# Patient Record
Sex: Female | Born: 1970 | Race: Asian | Hispanic: No | Marital: Married | Smoking: Never smoker
Health system: Southern US, Community
[De-identification: ages and names within clinical notes are randomized; demographics above are authoritative.]

## PROBLEM LIST (undated history)

## (undated) DIAGNOSIS — N920 Excessive and frequent menstruation with regular cycle: Secondary | ICD-10-CM

## (undated) DIAGNOSIS — I10 Essential (primary) hypertension: Secondary | ICD-10-CM

## (undated) DIAGNOSIS — E7800 Pure hypercholesterolemia, unspecified: Secondary | ICD-10-CM

## (undated) DIAGNOSIS — E041 Nontoxic single thyroid nodule: Secondary | ICD-10-CM

## (undated) DIAGNOSIS — G478 Other sleep disorders: Secondary | ICD-10-CM

## (undated) DIAGNOSIS — D649 Anemia, unspecified: Secondary | ICD-10-CM

## (undated) DIAGNOSIS — Z9289 Personal history of other medical treatment: Secondary | ICD-10-CM

## (undated) DIAGNOSIS — O039 Complete or unspecified spontaneous abortion without complication: Secondary | ICD-10-CM

---

## 2004-08-04 ENCOUNTER — Other Ambulatory Visit: Admission: RE | Admit: 2004-08-04 | Discharge: 2004-08-04 | Payer: Self-pay | Admitting: Gynecology

## 2004-09-27 ENCOUNTER — Encounter: Payer: Self-pay | Admitting: Emergency Medicine

## 2004-09-27 ENCOUNTER — Encounter: Payer: Self-pay | Admitting: Gynecology

## 2004-09-27 ENCOUNTER — Inpatient Hospital Stay (HOSPITAL_COMMUNITY): Admission: EM | Admit: 2004-09-27 | Discharge: 2004-10-06 | Payer: Self-pay | Admitting: Gynecology

## 2004-10-07 ENCOUNTER — Inpatient Hospital Stay (HOSPITAL_COMMUNITY): Admission: AD | Admit: 2004-10-07 | Discharge: 2004-10-07 | Payer: Self-pay | Admitting: Gynecology

## 2004-12-28 ENCOUNTER — Inpatient Hospital Stay (HOSPITAL_COMMUNITY): Admission: AD | Admit: 2004-12-28 | Discharge: 2004-12-30 | Payer: Self-pay | Admitting: Gynecology

## 2006-05-21 ENCOUNTER — Other Ambulatory Visit: Admission: RE | Admit: 2006-05-21 | Discharge: 2006-05-21 | Payer: Self-pay | Admitting: Family Medicine

## 2007-09-14 ENCOUNTER — Other Ambulatory Visit: Admission: RE | Admit: 2007-09-14 | Discharge: 2007-09-14 | Payer: Self-pay | Admitting: Family Medicine

## 2007-11-08 ENCOUNTER — Encounter (INDEPENDENT_AMBULATORY_CARE_PROVIDER_SITE_OTHER): Payer: Self-pay | Admitting: Interventional Radiology

## 2007-11-08 ENCOUNTER — Encounter: Admission: RE | Admit: 2007-11-08 | Discharge: 2007-11-08 | Payer: Self-pay | Admitting: Family Medicine

## 2007-11-08 ENCOUNTER — Other Ambulatory Visit: Admission: RE | Admit: 2007-11-08 | Discharge: 2007-11-08 | Payer: Self-pay | Admitting: Interventional Radiology

## 2009-03-26 ENCOUNTER — Other Ambulatory Visit: Admission: RE | Admit: 2009-03-26 | Discharge: 2009-03-26 | Payer: Self-pay | Admitting: Family Medicine

## 2009-06-17 IMAGING — US US BIOPSY
1 series · 14 of 19 positions shown · non-contrast
Comparison: none

CLINICAL HISTORY: Bilateral thyroid nodules.

[Series 1: us biopsy · 0.08mm/px · 19 acquisitions, 14 frames shown]
[im 1/19]
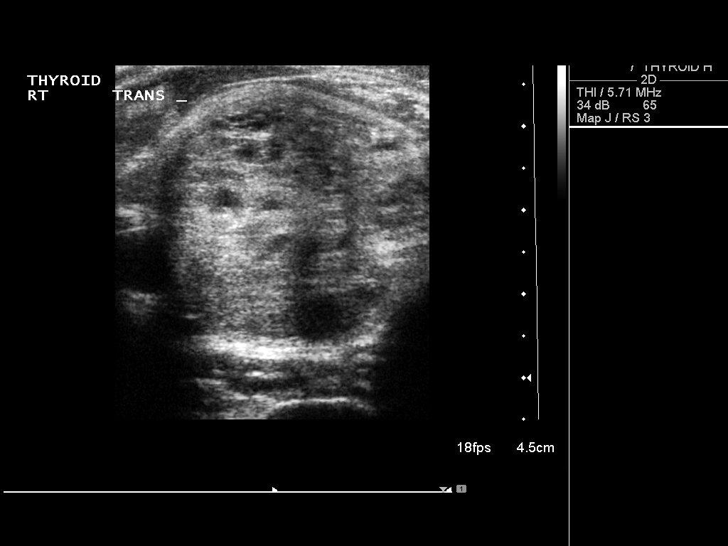
[im 3/19]
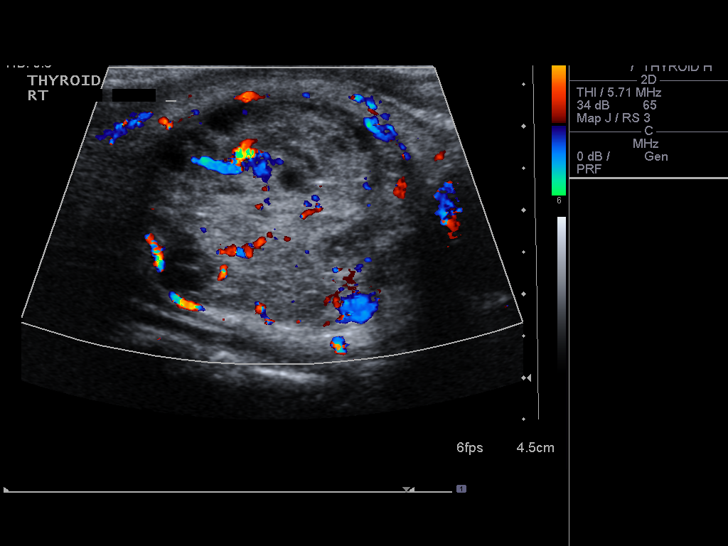
[im 4/19]
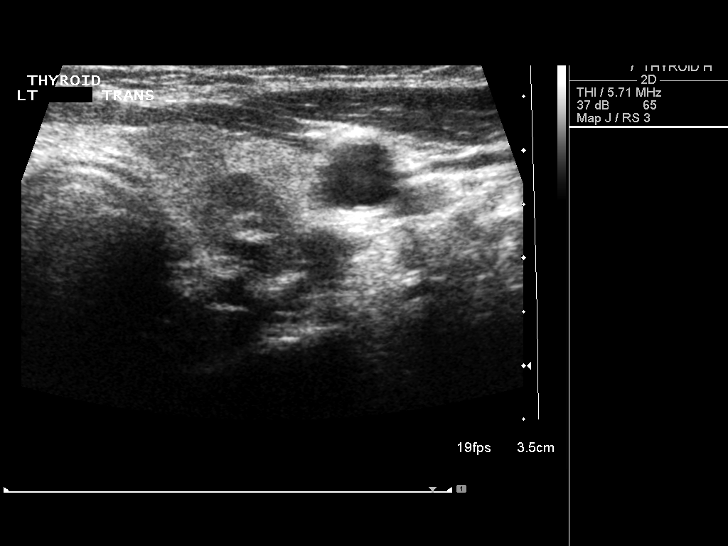
[im 5/19]
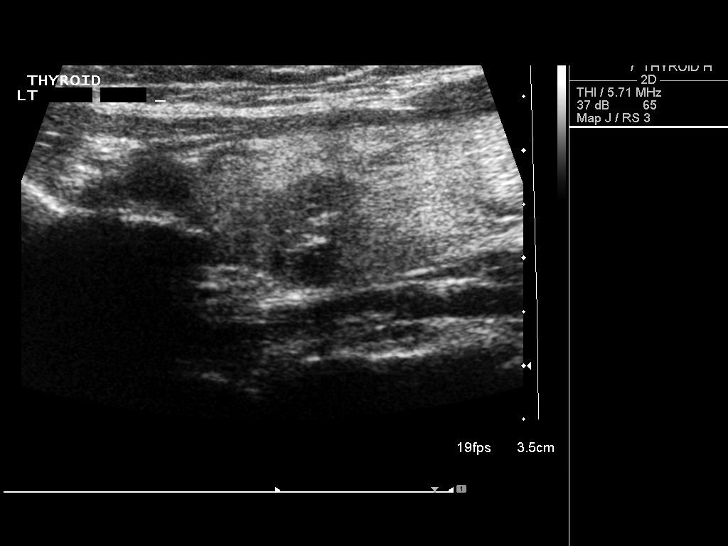
[im 7/19]
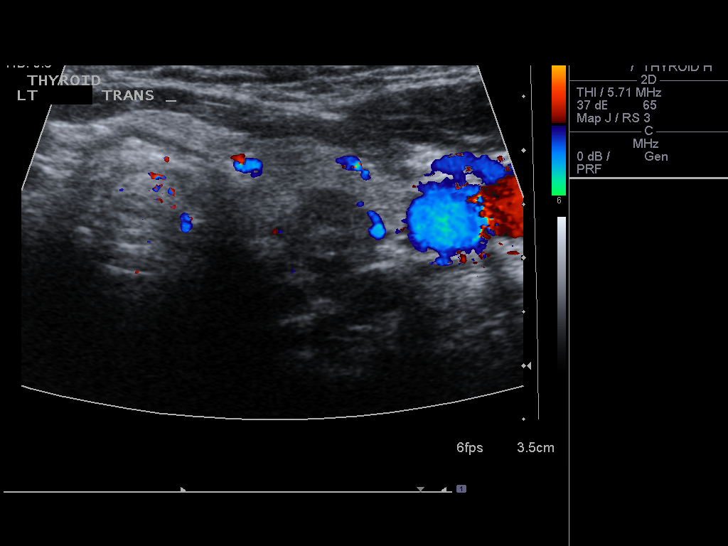
[im 8/19]
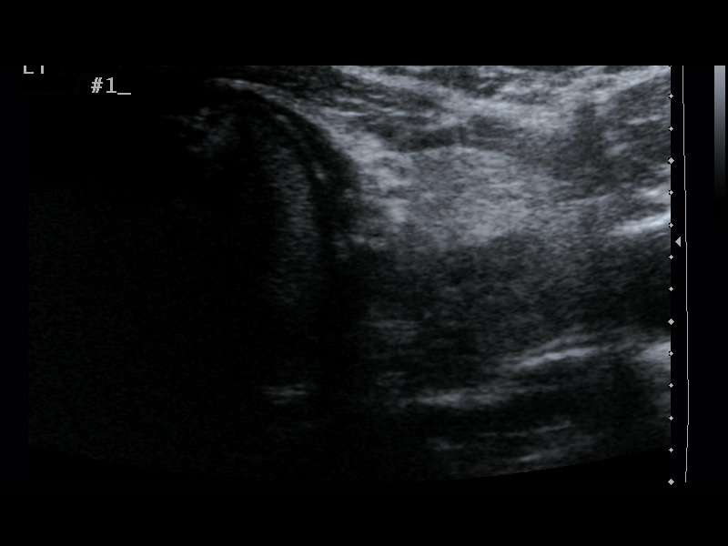
[im 9/19]
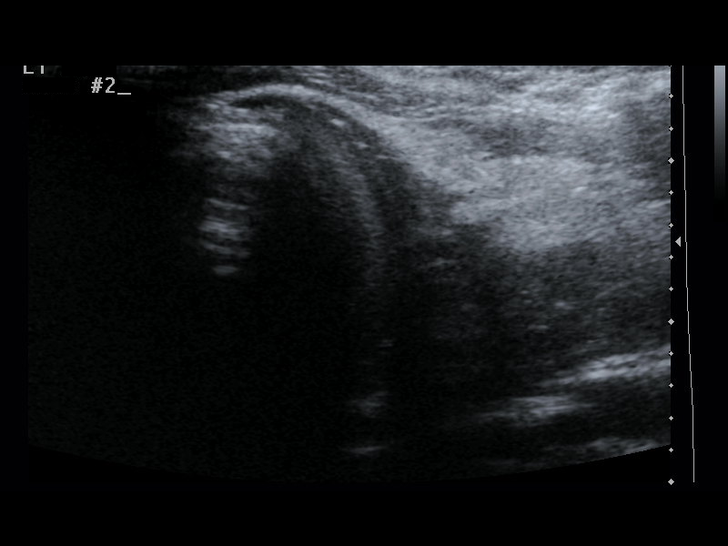
[im 11/19]
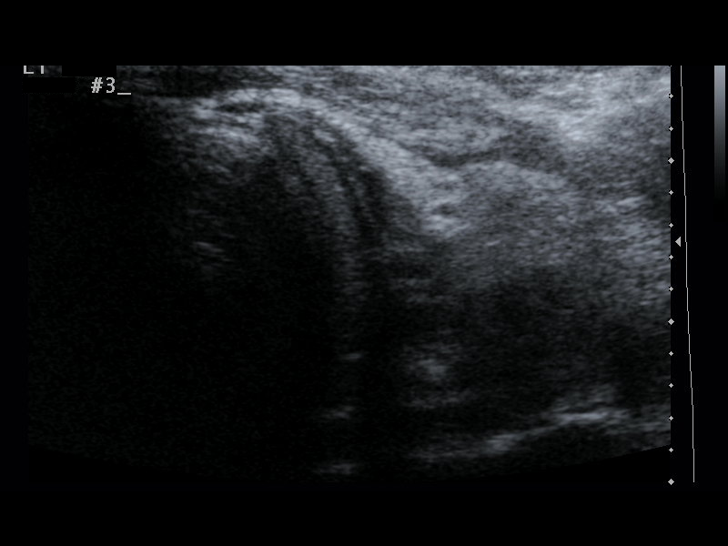
[im 12/19]
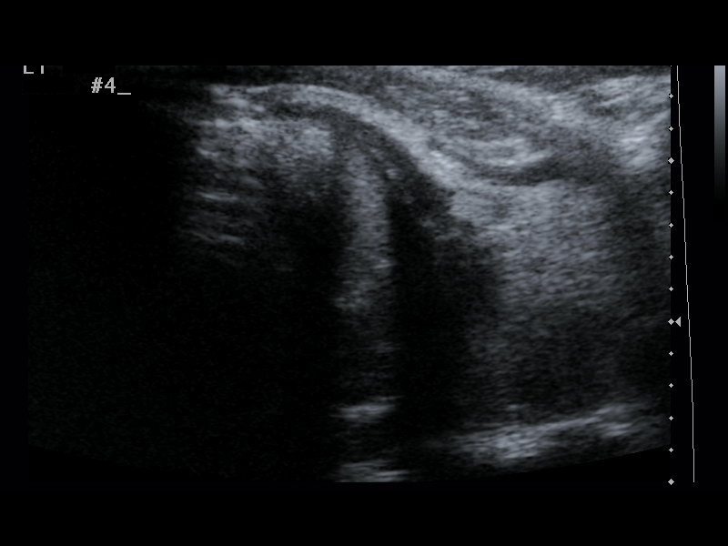
[im 13/19]
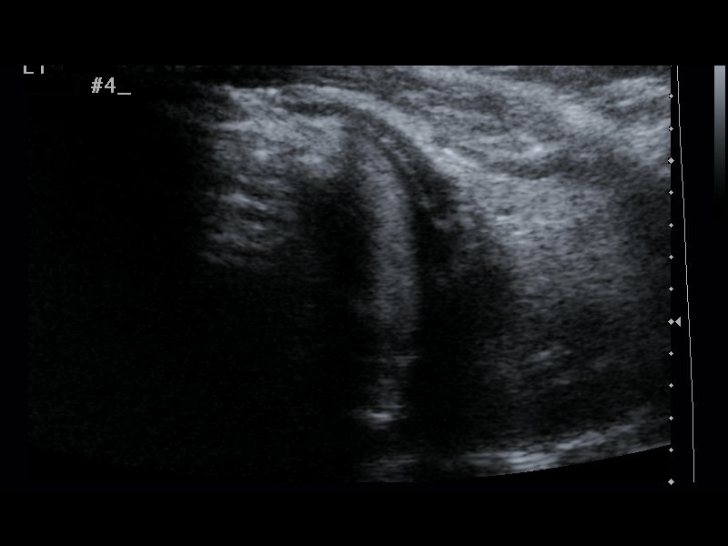
[im 15/19]
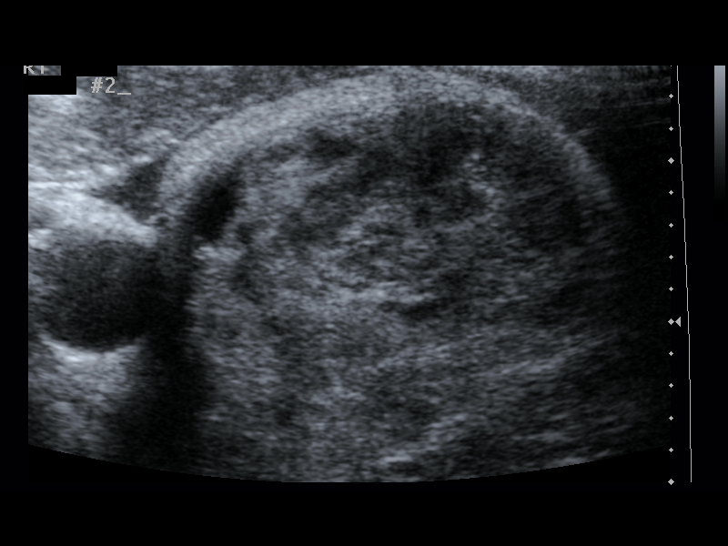
[im 16/19]
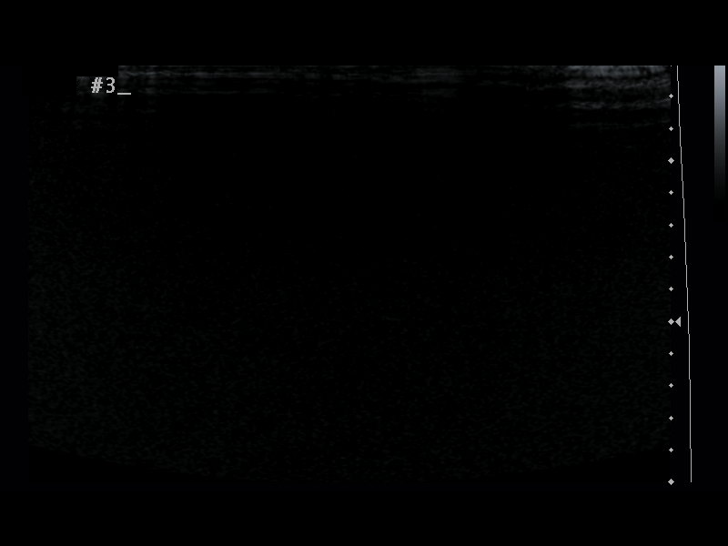
[im 17/19]
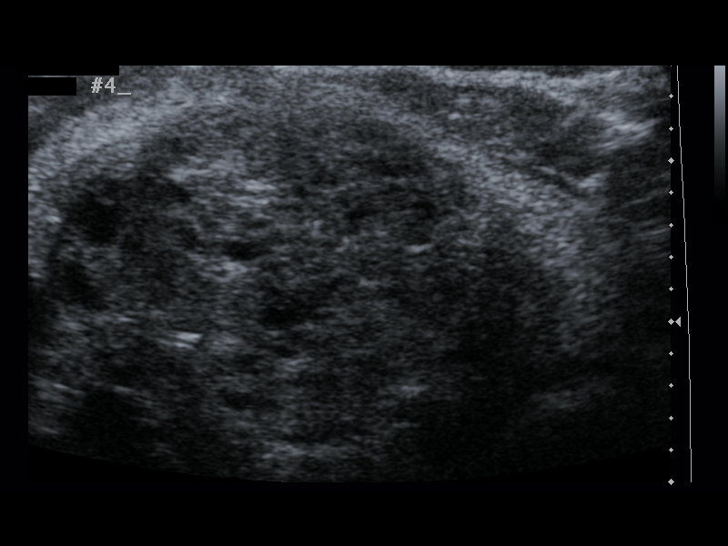
[im 19/19]
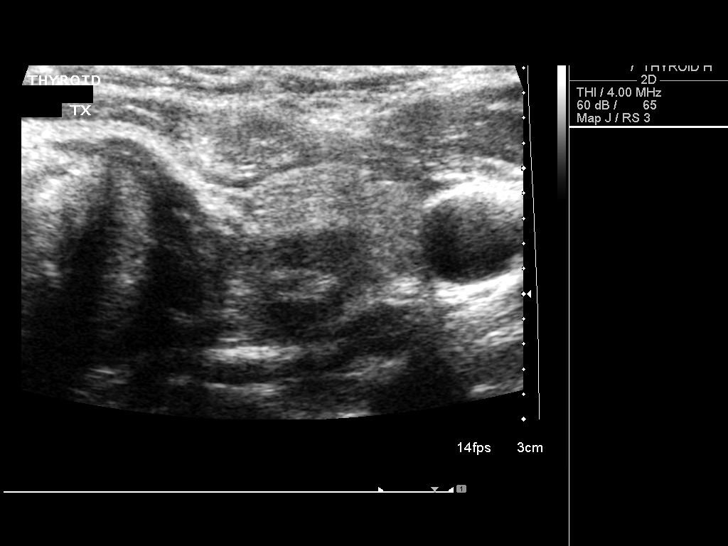

[14 of 19 positions shown; findings below may reference images not displayed]

PROCEDURE(S): ULTRASOUND GUIDED FINE NEEDLE ASPIRATION OF BILATERAL
THYROID NODULES

Medications:None

Sedation time:None

Procedure:Informed consent was obtained.  The patient was placed in
a supine position.  Ultrasound demonstrated a dominant right
thyroid nodule.  A small irregular nodule was identified in the
superior left thyroid nodule.  The neck was prepped with Betadine.
The skin was anesthetized with 1% lidocaine.  Using ultrasound
guidance, four fine needle aspirations were obtained of the left
superior thyroid nodule.  Four fine needle aspirations were
obtained of the large right thyroid nodule.  No immediate
complications.
FINDINGS: Large heterogeneous lesion involving the lower pole of the
right thyroid gland.  There are multiple left thyroid nodules.  The
nodule in the superior aspect of the left thyroid nodule has
internal echogenicity that could represent calcifications.
IMPRESSION: Ultrasound guided bilateral thyroid biopsies.

## 2010-03-23 DIAGNOSIS — E041 Nontoxic single thyroid nodule: Secondary | ICD-10-CM

## 2010-03-23 HISTORY — DX: Nontoxic single thyroid nodule: E04.1

## 2010-04-13 ENCOUNTER — Encounter: Payer: Self-pay | Admitting: Family Medicine

## 2010-05-26 ENCOUNTER — Other Ambulatory Visit (HOSPITAL_COMMUNITY)
Admission: RE | Admit: 2010-05-26 | Discharge: 2010-05-26 | Disposition: A | Discharge status: Home / Self Care | Payer: 59 | Source: Ambulatory Visit | Attending: Family Medicine | Admitting: Family Medicine

## 2010-05-26 ENCOUNTER — Other Ambulatory Visit: Payer: Self-pay | Admitting: Family Medicine

## 2010-05-26 DIAGNOSIS — Z124 Encounter for screening for malignant neoplasm of cervix: Secondary | ICD-10-CM | POA: Insufficient documentation

## 2010-05-26 DIAGNOSIS — Z1159 Encounter for screening for other viral diseases: Secondary | ICD-10-CM | POA: Insufficient documentation

## 2010-05-27 ENCOUNTER — Other Ambulatory Visit: Payer: Self-pay | Admitting: Obstetrics & Gynecology

## 2010-05-27 DIAGNOSIS — Z1231 Encounter for screening mammogram for malignant neoplasm of breast: Secondary | ICD-10-CM

## 2010-05-28 ENCOUNTER — Other Ambulatory Visit: Payer: Self-pay | Admitting: Family Medicine

## 2010-05-28 DIAGNOSIS — E041 Nontoxic single thyroid nodule: Secondary | ICD-10-CM

## 2010-06-05 ENCOUNTER — Ambulatory Visit: Payer: Self-pay

## 2010-06-05 ENCOUNTER — Other Ambulatory Visit: Payer: Self-pay
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
## 2016-03-26 DIAGNOSIS — N926 Irregular menstruation, unspecified: Secondary | ICD-10-CM | POA: Diagnosis not present

## 2016-03-26 DIAGNOSIS — E041 Nontoxic single thyroid nodule: Secondary | ICD-10-CM | POA: Diagnosis not present

## 2016-03-26 DIAGNOSIS — R5383 Other fatigue: Secondary | ICD-10-CM | POA: Diagnosis not present

## 2016-03-27 ENCOUNTER — Observation Stay (HOSPITAL_COMMUNITY): Payer: 59

## 2016-03-27 ENCOUNTER — Inpatient Hospital Stay (HOSPITAL_COMMUNITY)
Admission: EM | Admit: 2016-03-27 | Discharge: 2016-03-29 | DRG: 761 | Disposition: A | Discharge status: Home / Self Care | Payer: 59 | Attending: Internal Medicine | Admitting: Internal Medicine

## 2016-03-27 ENCOUNTER — Encounter (HOSPITAL_COMMUNITY): Payer: Self-pay

## 2016-03-27 ENCOUNTER — Other Ambulatory Visit (HOSPITAL_COMMUNITY): Payer: Self-pay

## 2016-03-27 DIAGNOSIS — D649 Anemia, unspecified: Secondary | ICD-10-CM | POA: Diagnosis not present

## 2016-03-27 DIAGNOSIS — E042 Nontoxic multinodular goiter: Secondary | ICD-10-CM | POA: Diagnosis present

## 2016-03-27 DIAGNOSIS — N859 Noninflammatory disorder of uterus, unspecified: Secondary | ICD-10-CM

## 2016-03-27 DIAGNOSIS — N924 Excessive bleeding in the premenopausal period: Secondary | ICD-10-CM | POA: Diagnosis not present

## 2016-03-27 DIAGNOSIS — D509 Iron deficiency anemia, unspecified: Secondary | ICD-10-CM | POA: Diagnosis present

## 2016-03-27 DIAGNOSIS — E611 Iron deficiency: Secondary | ICD-10-CM | POA: Diagnosis not present

## 2016-03-27 DIAGNOSIS — N920 Excessive and frequent menstruation with regular cycle: Secondary | ICD-10-CM | POA: Diagnosis present

## 2016-03-27 DIAGNOSIS — D259 Leiomyoma of uterus, unspecified: Principal | ICD-10-CM | POA: Diagnosis present

## 2016-03-27 DIAGNOSIS — E041 Nontoxic single thyroid nodule: Secondary | ICD-10-CM | POA: Diagnosis not present

## 2016-03-27 DIAGNOSIS — K5900 Constipation, unspecified: Secondary | ICD-10-CM | POA: Diagnosis present

## 2016-03-27 DIAGNOSIS — N858 Other specified noninflammatory disorders of uterus: Secondary | ICD-10-CM | POA: Diagnosis present

## 2016-03-27 DIAGNOSIS — N939 Abnormal uterine and vaginal bleeding, unspecified: Secondary | ICD-10-CM

## 2016-03-27 DIAGNOSIS — R634 Abnormal weight loss: Secondary | ICD-10-CM | POA: Diagnosis present

## 2016-03-27 HISTORY — DX: Complete or unspecified spontaneous abortion without complication: O03.9

## 2016-03-27 HISTORY — DX: Excessive and frequent menstruation with regular cycle: N92.0

## 2016-03-27 HISTORY — DX: Nontoxic single thyroid nodule: E04.1

## 2016-03-27 LAB — COMPREHENSIVE METABOLIC PANEL
ALK PHOS: 53 U/L (ref 38–126)
ALT: 10 U/L — AB (ref 14–54)
AST: 22 U/L (ref 15–41)
Albumin: 3.8 g/dL (ref 3.5–5.0)
Anion gap: 10 (ref 5–15)
BUN: 10 mg/dL (ref 6–20)
CALCIUM: 8.8 mg/dL — AB (ref 8.9–10.3)
CO2: 23 mmol/L (ref 22–32)
CREATININE: 0.89 mg/dL (ref 0.44–1.00)
Chloride: 103 mmol/L (ref 101–111)
GFR calc non Af Amer: >60 mL/min (ref >60)
Glucose, Bld: 112 mg/dL — ABNORMAL HIGH (ref 65–99)
Potassium: 3.6 mmol/L (ref 3.5–5.1)
SODIUM: 136 mmol/L (ref 135–145)
Total Bilirubin: 0.4 mg/dL (ref 0.3–1.2)
Total Protein: 6.9 g/dL (ref 6.5–8.1)

## 2016-03-27 LAB — CBC
HCT: 13.6 % — ABNORMAL LOW (ref 36.0–46.0)
Hemoglobin: 3.3 g/dL — CL (ref 12.0–15.0)
MCH: 12.7 pg — ABNORMAL LOW (ref 26.0–34.0)
MCHC: 24.3 g/dL — AB (ref 30.0–36.0)
MCV: 52.5 fL — AB (ref 78.0–100.0)
PLATELETS: 567 10*3/uL — AB (ref 150–400)
RBC: 2.59 MIL/uL — ABNORMAL LOW (ref 3.87–5.11)
RDW: 24.9 % — AB (ref 11.5–15.5)
WBC: 3.7 10*3/uL — ABNORMAL LOW (ref 4.0–10.5)

## 2016-03-27 LAB — ABO/RH: ABO/RH(D): O POS

## 2016-03-27 LAB — PREPARE RBC (CROSSMATCH)

## 2016-03-27 MED ORDER — ACETAMINOPHEN 650 MG RE SUPP: 650.0000 mg | Freq: Four times a day (QID) | RECTAL | Status: DC | PRN

## 2016-03-27 MED ORDER — ONDANSETRON HCL 4 MG PO TABS: 4.0000 mg | ORAL_TABLET | Freq: Four times a day (QID) | ORAL | Status: DC | PRN

## 2016-03-27 MED ORDER — ONDANSETRON HCL 4 MG/2ML IJ SOLN: 4.0000 mg | Freq: Four times a day (QID) | INTRAMUSCULAR | Status: DC | PRN

## 2016-03-27 MED ORDER — SODIUM CHLORIDE 0.9 % IV SOLN: 250.0000 mL | INTRAVENOUS | Status: DC | PRN

## 2016-03-27 MED ORDER — FERROUS SULFATE 325 (65 FE) MG PO TABS
325.0000 mg | ORAL_TABLET | Freq: Three times a day (TID) | ORAL | Status: DC
  Administered 2016-03-28 – 2016-03-29 (×5): 325 mg via ORAL
  Filled 2016-03-27 (×5): qty 1

## 2016-03-27 MED ORDER — ACETAMINOPHEN 325 MG PO TABS
650.0000 mg | ORAL_TABLET | Freq: Four times a day (QID) | ORAL | Status: DC | PRN
  Administered 2016-03-28: 650 mg via ORAL
  Filled 2016-03-27: qty 2

## 2016-03-27 MED ORDER — SODIUM CHLORIDE 0.9% FLUSH: 3.0000 mL | INTRAVENOUS | Status: DC | PRN

## 2016-03-27 MED ORDER — SODIUM CHLORIDE 0.9 % IV SOLN
Freq: Once | INTRAVENOUS | Status: AC
  Administered 2016-03-27: 15:00:00 via INTRAVENOUS

## 2016-03-27 MED ORDER — SODIUM CHLORIDE 0.9% FLUSH
3.0000 mL | Freq: Two times a day (BID) | INTRAVENOUS | Status: DC
  Administered 2016-03-28 – 2016-03-29 (×3): 3 mL via INTRAVENOUS

## 2016-03-27 NOTE — ED Notes (Signed)
Pt returned from US

## 2016-03-27 NOTE — ED Notes (Signed)
Unable to take report will call back at [phone]

## 2016-03-27 NOTE — ED Triage Notes (Signed)
Per Pt, Pt went for check-up at her MD yesterday. Pt had blood work drawn and was noted to have hemoglobin of 3 per family member's report. Pt is pale upon arrival, and reports fatigue and lightheadedness. Denies any blood in stool or urine. Denies any nausea, vomiting, or diarrhea. Reports continual menstrual cycle since August.

## 2016-03-27 NOTE — ED Provider Notes (Signed)
[facility] DEPT Provider Note   CSN: [id number] Arrival date & time: 03/27/16  1240     History   Chief Complaint Chief Complaint  Patient presents with  . Anemia    HPI Jelani Babineau is a 46 y.o. female.  HPI G4 P3 female with constant vaginal bleeding for the past 5 months. States that has been using 4 pads daily. Over the last few weeks vaginal bleeding has decreased. He complains of lower abdominal fullness and pain. Has had lightheadedness and generalized fatigue. Denies any blood in the stool. Admits to 10 pound unintentional weight loss. Patient speaks [place] and son is at bedside interpreting. Patient does not have a GYN.  Past Medical History:  Diagnosis Date  . Menorrhagia   . Spontaneous abortion   . Thyroid nodule 2012    Patient Active Problem List   Diagnosis Date Noted  . Symptomatic anemia 03/27/2016  . Thyroid nodule 03/27/2016  . Iron deficiency 03/27/2016  . Menorrhagia 03/27/2016  . Uterine mass 03/27/2016  . Weight loss 03/27/2016    History reviewed. No pertinent surgical history.  OB History    No data available       Home Medications    Prior to Admission medications   Not on File    Family History Family History  Problem Relation Age of Onset  . Stroke Mother   . Thyroid disease Sister   . Heart disease Brother   . Hypertension Brother     Social History Social History  Substance Use Topics  . Smoking status: Never Smoker  . Smokeless tobacco: Never Used  . Alcohol use No     Allergies   Patient has no known allergies.   Review of Systems Review of Systems  Constitutional: Positive for fatigue. Negative for chills and fever.  Respiratory: Negative for shortness of breath.   Cardiovascular: Negative for chest pain.  Gastrointestinal: Positive for abdominal distention and abdominal pain. Negative for diarrhea, nausea and vomiting.  Genitourinary: Positive for pelvic pain and vaginal bleeding. Negative for  dysuria, flank pain, frequency, hematuria and vaginal discharge.  Musculoskeletal: Negative for back pain, neck pain and neck stiffness.  Neurological: Positive for dizziness, weakness (generalized) and light-headedness. Negative for numbness and headaches.  All other systems reviewed and are negative.    Physical Exam Updated Vital Signs BP 133/74   Pulse 68   Temp 97.9 F (36.6 C) (Oral)   Resp 19   Ht 5\' 6"  (1.676 m)   Wt 140 lb (63.5 kg)   SpO2 100%   BMI 22.60 kg/m   Physical Exam  Constitutional: She is oriented to person, place, and time. She appears well-developed and well-nourished. No distress.  Pale appearing  HENT:  Head: Normocephalic and atraumatic.  Mouth/Throat: Oropharynx is clear and moist.  Pale mucous membranes  Eyes: EOM are normal. Pupils are equal, round, and reactive to light.  Neck: Normal range of motion. Neck supple.  Cardiovascular: Normal rate and regular rhythm.  Exam reveals no gallop and no friction rub.   No murmur heard. Pulmonary/Chest: Effort normal and breath sounds normal.  Abdominal: Soft. Bowel sounds are normal. She exhibits distension. There is no tenderness. There is no rebound and no guarding.  Abdominal mass. Mass is firm and mildly tender to palpation. Nonpulsatile.  Genitourinary:  Genitourinary Comments: Small amount of blood in the vaginal vault.   Musculoskeletal: Normal range of motion. She exhibits no edema or tenderness.  No CVA tenderness bilaterally.  Neurological: She is  alert and oriented to person, place, and time.  Moving all extremities without deficit. Sensation intact.  Skin: Skin is warm and dry. No rash noted. No erythema.  Psychiatric: She has a normal mood and affect. Her behavior is normal.  Nursing note and vitals reviewed.    ED Treatments / Results  Labs (all labs ordered are listed, but only abnormal results are displayed) Labs Reviewed  COMPREHENSIVE METABOLIC PANEL - Abnormal; Notable for the  following:       Result Value   Glucose, Bld 112 (*)    Calcium 8.8 (*)    ALT 10 (*)    All other components within normal limits  CBC - Abnormal; Notable for the following:    WBC 3.7 (*)    RBC 2.59 (*)    Hemoglobin 3.3 (*)    HCT 13.6 (*)    MCV 52.5 (*)    MCH 12.7 (*)    MCHC 24.3 (*)    RDW 24.9 (*)    Platelets 567 (*)    All other components within normal limits  POC OCCULT BLOOD, ED  TYPE AND SCREEN  ABO/RH  PREPARE RBC (CROSSMATCH)    EKG  EKG Interpretation None       Radiology No results found.  Procedures Procedures (including critical care time)  Medications Ordered in ED Medications  0.9 %  sodium chloride infusion ( Intravenous New Bag/Given 03/27/16 1512)     Initial Impression / Assessment and Plan / ED Course  I have reviewed the triage vital signs and the nursing notes.  Pertinent labs & imaging results that were available during my care of the patient were reviewed by me and considered in my medical decision making (see chart for details).  Clinical Course     Discussed with Dr. Roselie Awkward who will arrange to have the patient followed up in clinic. Initiated transfusion para blood cells in the emergency department. Vital signs remained stable. [facility] to admit.  Final Clinical Impressions(s) / ED Diagnoses   Final diagnoses:  Vaginal bleeding    New Prescriptions New Prescriptions   No medications on file     Julianne Rice, MD 03/27/16 1642

## 2016-03-27 NOTE — Progress Notes (Signed)
Patient arrived from ED alert and oriented, IV fluids infusing, son at bedside who is her translator, VSS. No pain at the moment. Oriented to room and staff. Will continue to monitor.

## 2016-03-27 NOTE — H&P (Signed)
History and Physical:    [name]   [id number] DOB: 1971-02-24 DOA: 03/27/2016  Referring MD/provider: Dr. Lita Mains PCP: Tawanna Solo, MD   Patient coming from: Home  Chief Complaint: Excessive vaginal bleeding  History of Present Illness:   [name] is an 46 y.o. 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  Unfortunately the patient was lost to follow-up from clearly reasons. She does not speak [place], and no interpreter is available who speaks her regional [place]. She did not understand the [place] interpreter that was available. Her son, therefore, has provided most of the history. Patient states that she has had heavy vaginal bleeding for the past 5 months. She has had 4 pregnancies (one was a miscarriage). She does not take any medications except for occasional Tylenol or Advil for abdominal cramping. She saw a PCP yesterday for evaluation of generalized malaise, dizziness, and nonspecific complaints of "not feeling well". Routine labs resulted today showing a hemoglobin of 3.3, and therefore the patient was advised to come to the ED for further evaluation.  ED Course:  The patient had a confirmed hemoglobin of 3.3 mg/dL. Bedside ultrasound done by the EDP confirmed a large intrauterine mass, approximately 10 cm. Dr. Roselie Awkward of gynecology consulted who recommended stabilization and outpatient follow-up.  ROS:   Review of Systems  Constitutional: Positive for fever, malaise/fatigue and weight loss.       Loss of appetite  HENT: Positive for sinus pain and sore throat.   Eyes: Positive for blurred vision.  Respiratory: Positive for shortness of breath.   Cardiovascular: Positive for chest pain and palpitations.  Gastrointestinal: Positive for abdominal pain and nausea. Negative for blood in stool, melena and vomiting.  Genitourinary: Negative for dysuria and  hematuria.  Musculoskeletal: Negative.   Skin: Negative.   Neurological: Positive for dizziness and weakness.  Endo/Heme/Allergies: Does not bruise/bleed easily.  Psychiatric/Behavioral: Negative.     Past Medical History:   Past Medical History:  Diagnosis Date  . Menorrhagia   . Spontaneous abortion   . Thyroid nodule 2012    Past Surgical History:   History reviewed. No pertinent surgical history.  Social History:   Social History   Social History  . Marital status: Married    Spouse name: N/A  . Number of children: N/A  . Years of education: N/A   Occupational History  . Housekeeper    Social History Main Topics  . Smoking status: Never Smoker  . Smokeless tobacco: Never Used  . Alcohol use No  . Drug use: No  . Sexual activity: Not on file   Other Topics Concern  . 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  3 children: ages 39, 62 and 70.  Married.    Allergies   Patient has no known allergies.  Family history:   Family History  Problem Relation Age of Onset  . Stroke Mother   . Thyroid disease Sister   . Heart disease Brother   . Hypertension Brother     Current Medications:   Prior to Admission medications   Not on File    Physical Exam:   Vitals:   03/27/16 1530 03/27/16 1533 03/27/16 1545 03/27/16 1600  BP: 127/74 127/74 130/71 125/76  Pulse: 68 75 70 68  Resp: 10 18 13 17   Temp:  97.9 F (36.6 C)  TempSrc:  Oral    SpO2: 100% 100% 100% 100%  Weight:      Height:         Physical Exam: Blood pressure 125/76, pulse 68, temperature 97.9 F (36.6 C), temperature source Oral, resp. rate 17, height 5\' 6"  (1.676 m), weight 63.5 kg (140 lb), SpO2 100 %. Gen: No acute distress.Pale appearing. Head: Normocephalic, atraumatic. Eyes: Pupils equal, round and reactive to light. Extraocular movements intact.  Sclerae nonicteric. No lid lag. Mouth: Oropharynx reveals moist  mucous membranes. Dentition is good. Neck: Supple, no lymphadenopathy, no jugular venous distention. 3 cm right sided thyroid nodule palpable, firm. Chest: Lungs are clear to auscultation with good air movement. No rales, rhonchi or wheezes.  CV: Heart sounds are regular with an S1, S2. No murmurs, rubs, clicks, or gallops.  Abdomen: Mass felt that extends to the umbilicus, firm. Mildly tender to palpation. Extremities: Extremities are without clubbing, edema, or cyanosis. Pedal pulses 2+.  Skin: Warm and dry. No rashes, lesions or wounds. Pale. Neuro: Alert and oriented times 3; grossly nonfocal.  Psych: Insight is good and judgment is appropriate. Mood and affect normal.   Data Review:    Labs: Basic Metabolic Panel:  Recent Labs Lab 03/27/16 1305  NA 136  K 3.6  CL 103  CO2 23  GLUCOSE 112*  BUN 10  CREATININE 0.89  CALCIUM 8.8*   Liver Function Tests:  Recent Labs Lab 03/27/16 1305  AST 22  ALT 10*  ALKPHOS 53  BILITOT 0.4  PROT 6.9  ALBUMIN 3.8   CBC:  Recent Labs Lab 03/27/16 1305  WBC 3.7*  HGB 3.3*  HCT 13.6*  MCV 52.5*  PLT 567*    Radiographic Studies: No results found.  EKG: Independently reviewed. Sinus rhythm at 80 bpm. No ischemic changes.   Assessment/Plan:   Principal Problem:   Symptomatic anemia secondary to menorrhagia/uterine mass with associated iron deficiency Patient will need a further gynecologic evaluation. Patient will be referred for observation to stabilize her hemoglobin. Will start with 2 units of PRBCs, repeat her hemoglobin, and transfuse to hemoglobin greater than 7. Start iron supplementation. Transvaginal ultrasound ordered to evaluate uterine mass. The EDP has spoken with Dr. Roselie Awkward who will follow the patient posthospitalization. A discharge summary will be sent to Dr. Roselie Awkward on discharge to ensure appropriate follow-up.  Active Problems:   Thyroid nodule I have ordered a repeat thyroid ultrasound/biopsy. Check  TSH/free T4.    Weight loss Follow-up thyroid studies. Malignancy is a concern given uterine and thyroid masses.   Other information:   DVT prophylaxis: SCDs. Code Status: Full code. Family Communication: Son at the bedside.  Disposition Plan: Home 03/28/16 after correction of hemoglobin to greater than 7. Consults called: Gynecology: Dr. Roselie Awkward who recommends outpatient follow-up. Admission status: Observation.    The medical decision making is of moderate complexity, therefore this is a level 2 visit.  RAMA,CHRISTINA [facility] Pager [phone] Cell: [phone]   If 7PM-7AM, please contact night-coverage [email or web address] Password [email or web address] 03/27/2016, 4:32 PM

## 2016-03-27 NOTE — ED Notes (Signed)
Complains of pain all over her body and also of feeling weak all over for the past few months, edp at bedside, patient speaks minimal english and request that her son translate for her

## 2016-03-27 NOTE — ED Notes (Signed)
EKG given to Dr. Yelverton 

## 2016-03-27 NOTE — ED Notes (Signed)
Dr. Johnney Killian made aware of pts hgb of 3.3. No new verbal orders at this time. [place] Agricultural consultant also informed and pt to be takan back to next available room. Pt sitting in waiting room at this time playing on cell phone and interacting appropriately with family.

## 2016-03-28 DIAGNOSIS — R634 Abnormal weight loss: Secondary | ICD-10-CM | POA: Diagnosis present

## 2016-03-28 DIAGNOSIS — N920 Excessive and frequent menstruation with regular cycle: Secondary | ICD-10-CM | POA: Diagnosis not present

## 2016-03-28 DIAGNOSIS — D509 Iron deficiency anemia, unspecified: Secondary | ICD-10-CM | POA: Diagnosis present

## 2016-03-28 DIAGNOSIS — E042 Nontoxic multinodular goiter: Secondary | ICD-10-CM | POA: Diagnosis present

## 2016-03-28 DIAGNOSIS — D649 Anemia, unspecified: Secondary | ICD-10-CM

## 2016-03-28 DIAGNOSIS — D259 Leiomyoma of uterus, unspecified: Secondary | ICD-10-CM | POA: Diagnosis present

## 2016-03-28 DIAGNOSIS — K5900 Constipation, unspecified: Secondary | ICD-10-CM | POA: Diagnosis present

## 2016-03-28 LAB — PREPARE RBC (CROSSMATCH)

## 2016-03-28 LAB — CBC
HCT: 29.2 % — ABNORMAL LOW (ref 36.0–46.0)
HCT: 28.8 % — ABNORMAL LOW (ref 36.0–46.0)
Hemoglobin: 9 g/dL — ABNORMAL LOW (ref 12.0–15.0)
Hemoglobin: 8.6 g/dL — ABNORMAL LOW (ref 12.0–15.0)
MCH: 19.4 pg — ABNORMAL LOW (ref 26.0–34.0)
MCH: 18.7 pg — ABNORMAL LOW (ref 26.0–34.0)
MCHC: 30.8 g/dL (ref 30.0–36.0)
MCHC: 29.9 g/dL — ABNORMAL LOW (ref 30.0–36.0)
MCV: 62.9 fL — ABNORMAL LOW (ref 78.0–100.0)
MCV: 62.7 fL — ABNORMAL LOW (ref 78.0–100.0)
PLATELETS: 504 10*3/uL — AB (ref 150–400)
PLATELETS: 476 10*3/uL — AB (ref 150–400)
RBC: 4.64 MIL/uL (ref 3.87–5.11)
RBC: 4.59 MIL/uL (ref 3.87–5.11)
RDW: 28.9 % — AB (ref 11.5–15.5)
WBC: 10.5 10*3/uL (ref 4.0–10.5)
WBC: 9.7 10*3/uL (ref 4.0–10.5)

## 2016-03-28 LAB — HEMOGLOBIN AND HEMATOCRIT, BLOOD
HEMATOCRIT: 20 % — AB (ref 36.0–46.0)
HEMOGLOBIN: 5.7 g/dL — AB (ref 12.0–15.0)

## 2016-03-28 LAB — T4, FREE: Free T4: 1.06 ng/dL (ref 0.61–1.12)

## 2016-03-28 LAB — TSH: TSH: 1.316 u[IU]/mL (ref 0.350–4.500)

## 2016-03-28 MED ORDER — FOLIC ACID 1 MG PO TABS
1.0000 mg | ORAL_TABLET | Freq: Every day | ORAL | Status: DC
  Administered 2016-03-28 – 2016-03-29 (×2): 1 mg via ORAL
  Filled 2016-03-28 (×2): qty 1

## 2016-03-28 MED ORDER — VITAMIN B-12 1000 MCG PO TABS
1000.0000 ug | ORAL_TABLET | Freq: Every day | ORAL | Status: DC
  Administered 2016-03-28 – 2016-03-29 (×2): 1000 ug via ORAL
  Filled 2016-03-28 (×2): qty 1

## 2016-03-28 MED ORDER — SODIUM CHLORIDE 0.9 % IV SOLN: Freq: Once | INTRAVENOUS | Status: DC

## 2016-03-28 NOTE — Progress Notes (Signed)
[facility] Progress Note  Patient: [name] [id number]   PCP: Tawanna Solo, MD DOB: [birth year]   DOA: 03/27/2016   DOS: 03/28/2016   Date of Service: the patient was seen and examined on 03/28/2016  Brief hospital course: Pt. with PMH of Uterine fibroid, thyroid nodule; admitted on 03/27/2016, with complaint of fatigue, was found to have symptomatic anemia with hemoglobin of 3.3. Currently further plan is to monitor H&H.  Assessment and Plan: 1. Symptomatic anemia Likely from menorrhagia. Patient has large uterine fibroid. Patient will follow-up with OB/GYN as an outpatient. H&H appears to be relatively stable. We will monitor overnight. S/P 4 PRBC. We'll start the patient on iron supplementation as well as folic acid.  2. Thyroid nodule. Patient will require outpatient workup with endocrinology per  3. Constipation. Resolved. Bowel regimen initiated.  Bowel regimen: last BM 03/28/2016 Diet: Regular diet DVT Prophylaxis: mechanical compression device  Advance goals of care discussion: full code  Family Communication: family was present at bedside, at the time of interview. The pt provided permission to discuss medical plan with the family. Opportunity was given to ask question and all questions were answered satisfactorily.   Disposition:  Discharge to home. Expected discharge date: 03/29/2016,  Consultants: none Procedures: PRBC  Antibiotics: Anti-infectives    None        Subjective: Feeling better, complains about mild abdominal pain.  Objective: Physical Exam: Vitals:   03/28/16 0638 03/28/16 0700 03/28/16 0930 03/28/16 1259  BP: 119/81 122/74 124/81 125/79  Pulse: 70 71 78 80  Resp: 18 17 16 17   Temp: 98.6 F (37 C) 98.4 F (36.9 C) 98.4 F (36.9 C) 97.5 F (36.4 C)  TempSrc: Oral Oral Oral Oral  SpO2: 99% 100% 97% 100%  Weight:      Height:        Intake/Output Summary (Last 24 hours) at 03/28/16 1818 Last data filed at 03/28/16  1500  Gross per 24 hour  Intake             1880 ml  Output              202 ml  Net             1678 ml   Filed Weights   03/27/16 1257  Weight: 63.5 kg (140 lb)    General: Alert, Awake and Oriented to Time, Place and Person. Appear in mild distress, affect appropriate Eyes: PERRL, Conjunctiva normal ENT: Oral Mucosa clear moist. Neck: no JVD, no Abnormal Mass Or lumps Cardiovascular: S1 and S2 Present, no Murmur, Respiratory: Bilateral Air entry equal and Decreased, no use of accessory muscle, Clear to Auscultation, no Crackles, no wheezes Abdomen: Bowel Sound present, Soft and mild tenderness Skin: no redness, no Rash, no induration Extremities: no Pedal edema, no calf tenderness Neurologic: Grossly no focal neuro deficit. Bilaterally Equal motor strength  Data Reviewed: CBC:  Recent Labs Lab 03/27/16 1305 03/28/16 0019 03/28/16 1131 03/28/16 1540  WBC 3.7*  --  10.5 9.7  HGB 3.3* 5.7* 9.0* 8.6*  HCT 13.6* 20.0* 29.2* 28.8*  MCV 52.5*  --  62.9* 62.7*  PLT 567*  --  504* 0000000*   Basic Metabolic Panel:  Recent Labs Lab 03/27/16 1305  NA 136  K 3.6  CL 103  CO2 23  GLUCOSE 112*  BUN 10  CREATININE 0.89  CALCIUM 8.8*    Liver Function Tests:  Recent Labs Lab 03/27/16 1305  AST 22  ALT 10*  ALKPHOS 53  BILITOT 0.4  PROT 6.9  ALBUMIN 3.8    Studies: No results found.   Scheduled Meds: . sodium chloride   Intravenous Once  . ferrous sulfate  325 mg Oral TID WC  . sodium chloride flush  3 mL Intravenous Q12H   Continuous Infusions: PRN Meds: sodium chloride, acetaminophen **OR** acetaminophen, ondansetron **OR** ondansetron (ZOFRAN) IV, sodium chloride flush  Time spent: 30 minutes  Author: Berle Mull, MD [facility] Pager: [phone] 03/28/2016 6:18 PM  If 7PM-7AM, please contact night-coverage at [email or web address], password [email or web address]

## 2016-03-29 LAB — BASIC METABOLIC PANEL
ANION GAP: 9 (ref 5–15)
BUN: 7 mg/dL (ref 6–20)
CO2: 20 mmol/L — ABNORMAL LOW (ref 22–32)
Calcium: 9 mg/dL (ref 8.9–10.3)
Chloride: 108 mmol/L (ref 101–111)
Creatinine, Ser: 0.73 mg/dL (ref 0.44–1.00)
GFR calc Af Amer: >60 mL/min (ref >60)
GLUCOSE: 97 mg/dL (ref 65–99)
POTASSIUM: 3.5 mmol/L (ref 3.5–5.1)
Sodium: 137 mmol/L (ref 135–145)

## 2016-03-29 LAB — TYPE AND SCREEN
Blood Product Expiration Date: 201801122359
Blood Product Expiration Date: 201801122359
Blood Product Expiration Date: 201801192359
Blood Product Expiration Date: 201801192359
ISSUE DATE / TIME: 201801051509
ISSUE DATE / TIME: 201801060631
ISSUE DATE / TIME: 201801051948
ISSUE DATE / TIME: 201801060320
UNIT TYPE AND RH: 5100
UNIT TYPE AND RH: 5100
Unit Type and Rh: 5100
Unit Type and Rh: 5100

## 2016-03-29 LAB — CBC
HEMATOCRIT: 29.6 % — AB (ref 36.0–46.0)
Hemoglobin: 8.8 g/dL — ABNORMAL LOW (ref 12.0–15.0)
MCH: 19 pg — ABNORMAL LOW (ref 26.0–34.0)
MCHC: 29.7 g/dL — ABNORMAL LOW (ref 30.0–36.0)
MCV: 64.1 fL — AB (ref 78.0–100.0)
PLATELETS: 456 10*3/uL — AB (ref 150–400)
RBC: 4.62 MIL/uL (ref 3.87–5.11)
RDW: 30.2 % — ABNORMAL HIGH (ref 11.5–15.5)
WBC: 7.1 10*3/uL (ref 4.0–10.5)

## 2016-03-29 MED ORDER — CYANOCOBALAMIN 1000 MCG PO TABS: 1000.0000 ug | ORAL_TABLET | Freq: Every day | ORAL | 0 refills | Status: DC

## 2016-03-29 MED ORDER — FERROUS SULFATE 325 (65 FE) MG PO TABS: 325.0000 mg | ORAL_TABLET | Freq: Three times a day (TID) | ORAL | 0 refills | Status: DC

## 2016-03-29 MED ORDER — FOLIC ACID 1 MG PO TABS: 1.0000 mg | ORAL_TABLET | Freq: Every day | ORAL | 0 refills | Status: DC

## 2016-03-29 NOTE — Progress Notes (Signed)
Per patient request, son was used as the interpretor. Discharge instructions gone over with patient and family. Home medications gone over. Prescriptions electronically sent. Follow up appointments to be made. Work note given. Diet, activity, and reasons to call the doctor gone over. Patient verbalized understanding of instructions.

## 2016-04-03 DIAGNOSIS — D509 Iron deficiency anemia, unspecified: Secondary | ICD-10-CM | POA: Diagnosis not present

## 2016-04-03 DIAGNOSIS — N920 Excessive and frequent menstruation with regular cycle: Secondary | ICD-10-CM | POA: Diagnosis not present

## 2016-04-07 NOTE — Discharge Summary (Signed)
[facility] Discharge Summary   Patient: [name] [id number]   PCP: Tawanna Solo, MD DOB: 08/28/1970   Date of admission: 03/27/2016   Date of discharge: 03/29/2016    Discharge Diagnoses:  Principal Problem:   Symptomatic anemia Active Problems:   Thyroid nodule   Iron deficiency   Menorrhagia   Uterine mass   Weight loss   Admitted From: home Disposition:  home  Recommendations for Outpatient Follow-up:  1. Follow-up with OB/GYN as recommended. 2. Follow-up with PCP in one week with CBC , get a referral to endocrinologist for the thyroid nodule.  Follow-up Information    Emeterio Reeve, MD. Schedule an appointment as soon as possible for a visit in 2 week(s).   Specialty:  Obstetrics and Gynecology Contact information: [place] [address] [phone]        Tawanna Solo, MD. Schedule an appointment as soon as possible for a visit in 1 week(s).   Specialty:  Family Medicine Why:  with CBC Contact information: [place] [address] [phone]          Diet recommendation: regular diet  Activity: The patient is advised to gradually reintroduce usual activities.  Discharge Condition: good  Code Status: full code  History of present illness: As per the H and P dictated on admission, "[name] is an 46 y.o. 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  Unfortunately the patient was lost to follow-up from clearly reasons. She does not speak [place], and no interpreter is available who speaks her regional [place]. She did not understand the [place] interpreter that was available. Her son, therefore, has provided most of the history. Patient states that she has had heavy vaginal bleeding for the past 5 months. She has had 4 pregnancies (one was a miscarriage). She does not take any medications except for  occasional Tylenol or Advil for abdominal cramping. She saw a PCP yesterday for evaluation of generalized malaise, dizziness, and nonspecific complaints of "not feeling well". Routine labs resulted today showing a hemoglobin of 3.3, and therefore the patient was advised to come to the ED for further evaluation."  Hospital Course:   Summary of her active problems in the hospital is as following. 1. Symptomatic anemia Likely from menorrhagia. Patient has large uterine fibroid. Patient will follow-up with OB/GYN as an outpatient. H&H appears to be relatively stable. S/P 4 PRBC. We'll start the patient on iron supplementation as well as folic acid.  2. Thyroid nodule. Patient will require outpatient workup with endocrinology per  3. Constipation. Resolved. Bowel regimen initiated.  All other chronic medical condition were stable during the hospitalization.  Patient was ambulatory without any assistance. On the day of the discharge the patient's vitals were stable , and no other acute medical condition were reported by patient. the patient was felt safe to be discharge at home with family.  Procedures and Results:  4 PRBC transfusion  1 IV iron infusion   Consultations:  Ob gyn phone consuklt  DISCHARGE MEDICATION: Discharge Medication List as of 03/29/2016 11:23 AM    START taking these medications   Details  ferrous sulfate 325 (65 FE) MG tablet Take 1 tablet (325 mg total) by mouth 3 (three) times daily with meals., Starting Sun [id number], Normal    folic acid (FOLVITE) 1 MG tablet Take 1 tablet (1 mg total) by mouth daily., Starting Sun 03/29/2016, Normal  vitamin B-12 1000 MCG tablet Take 1 tablet (1,000 mcg total) by mouth daily., Starting Sun 03/29/2016, Normal      CONTINUE these medications which have NOT CHANGED   Details  Acetaminophen (TYLENOL PO) Take 2 tablets by mouth every 6 (six) hours as needed (headache/pain)., Historical Med      STOP taking these  medications     Aspirin-Acetaminophen-Caffeine (GOODY HEADACHE PO)      ibuprofen (ADVIL,MOTRIN) 200 MG tablet        No Known Allergies Discharge Instructions    Ambulatory referral to Endocrinology    Complete by:  As directed    Thyroid nodule.   Diet general    Complete by:  As directed    Discharge instructions    Complete by:  As directed    It is important that you read following instructions as well as go over your medication list with RN to help you understand your care after this hospitalization.  Discharge Instructions: Please follow-up with PCP in one week  Please request your primary care physician to go over all Hospital Tests and Procedure/Radiological results at the follow up,  Please get all Hospital records sent to your PCP by signing hospital release before you go home.   Do not take more than prescribed Pain, Sleep and Anxiety Medications. You were cared for by a hospitalist during your hospital stay. If you have any questions about your discharge medications or the care you received while you were in the hospital after you are discharged, you can call the unit and ask to speak with the hospitalist on call if the hospitalist that took care of you is not available.  Once you are discharged, your primary care physician will handle any further medical issues. Please note that NO REFILLS for any discharge medications will be authorized once you are discharged, as it is imperative that you return to your primary care physician (or establish a relationship with a primary care physician if you do not have one) for your aftercare needs so that they can reassess your need for medications and monitor your lab values. You Must read complete instructions/literature along with all the possible adverse reactions/side effects for all the Medicines you take and that have been prescribed to you. Take any new Medicines after you have completely understood and accept all the possible  adverse reactions/side effects. Wear Seat belts while driving. If you have smoked or chewed Tobacco in the last 2 yrs please stop smoking and/or stop any Recreational drug use.   Increase activity slowly    Complete by:  As directed      Discharge Exam: Filed Weights   03/27/16 1257  Weight: 63.5 kg (140 lb)   Vitals:   03/28/16 2117 03/29/16 0602  BP: 115/71 106/67  Pulse: 74   Resp: 18 18  Temp: 98.6 F (37 C) 98.2 F (36.8 C)   General: Appear in no distress, no Rash; Oral Mucosa moist Cardiovascular: S1 and S2 Present, no Murmur, no JVD Respiratory: Bilateral Air entry present and Clear to Auscultation, no Crackles, no wheezes Abdomen: Bowel Sound present, Soft and no tenderness Extremities: no Pedal edema, no calf tenderness Neurology: Grossly no focal neuro deficit.  The results of significant diagnostics from this hospitalization (including imaging, microbiology, ancillary and laboratory) are listed below for reference.    Significant Diagnostic Studies: US Transvaginal Non-ob  Result Date: 03/27/2016 CLINICAL DATA:  Vaginal bleeding EXAM: TRANSABDOMINAL AND TRANSVAGINAL ULTRASOUND OF PELVIS TECHNIQUE: Both transabdominal and  transvaginal ultrasound examinations of the pelvis were performed. Transabdominal technique was performed for global imaging of the pelvis including uterus, ovaries, adnexal regions, and pelvic cul-de-sac. It was necessary to proceed with endovaginal exam following the transabdominal exam to visualize the uterus and ovaries. COMPARISON:  None FINDINGS: Uterus Measurements: 19.6 x 11.9 x 14.7 cm. Large fundal fibroid measures 13.7 x 11.0 x 12.1 cm. Endometrium Thickness: Not visualized secondary to fibroid. Right ovary Measurements: Not visualized. Left ovary Measurements: Not visualized. Other findings No abnormal free fluid. IMPRESSION: 1. Enlarged uterus contains a large fundal fibroid. Electronically Signed   By: Kerby Moors M.D.   On: 03/27/2016  18:13   US Pelvis Complete  Result Date: 03/27/2016 CLINICAL DATA:  Vaginal bleeding EXAM: TRANSABDOMINAL AND TRANSVAGINAL ULTRASOUND OF PELVIS TECHNIQUE: Both transabdominal and transvaginal ultrasound examinations of the pelvis were performed. Transabdominal technique was performed for global imaging of the pelvis including uterus, ovaries, adnexal regions, and pelvic cul-de-sac. It was necessary to proceed with endovaginal exam following the transabdominal exam to visualize the uterus and ovaries. COMPARISON:  None FINDINGS: Uterus Measurements: 19.6 x 11.9 x 14.7 cm. Large fundal fibroid measures 13.7 x 11.0 x 12.1 cm. Endometrium Thickness: Not visualized secondary to fibroid. Right ovary Measurements: Not visualized. Left ovary Measurements: Not visualized. Other findings No abnormal free fluid. IMPRESSION: 1. Enlarged uterus contains a large fundal fibroid. Electronically Signed   By: Kerby Moors M.D.   On: 03/27/2016 18:13   US Thyroid  Result Date: 03/27/2016 CLINICAL DATA:  Thyroid nodule. History of prior bilateral thyroid nodule biopsy involving a dominant right sided nodule and smaller irregular superior left thyroid nodule on 11/08/2007 EXAM: THYROID ULTRASOUND TECHNIQUE: Ultrasound examination of the thyroid gland and adjacent soft tissues was performed. COMPARISON:  Thyroid biopsy ultrasound from 11/08/2007. FINDINGS: Parenchymal Echotexture: Moderately heterogenous Estimated total number of nodules >/= 1 cm: 4 Number of spongiform nodules >/=  2 cm not described below (TR1): 0 Number of mixed cystic and solid nodules >/= 1.5 cm not described below (Harvel): 0 _________________________________________________________ Isthmus: 0.6 cm in length x 0.3 cm AP 0.9 x 0.4 x 0.6 cm hypoechoic solid noncalcified nodule with predominantly peripheral and slight intranodular vascularity. _________________________________________________________ Right lobe: 7 x 3.8 x 4.9 cm. Dominant 4.5 x 3.7 x 4.5 cm mid  predominantly solid and partially cystic nodule with spongiform areas within and scattered echogenic foci consistent with calcifications. Peripheral an intra nodular flow is demonstrated. _________________________________________________________ Left lobe: 5.2 x 1.8 x 1.9 cm The following nodules are found in the left lobe: 1. 1.4 x 1 x 1.1 cm midpole medial hypoechoic solid nodule with central calcifications and peripheral vascularity. 2. 1 x 1 x 0.8 cm midpole medial homogeneously solid and echogenic well-circumscribed nodule with peripheral and intra nodular vascularity. 3. 2.1 x 1.2 x 1.4 cm lower pole lateral nodule solid isodense to slightly hypoechoic nodule with thin hypoechoic rim and peripheral vascularity. 4. 1.6 x 1 x 1.1 cm lower pole lateral predominantly solid echogenic in and slightly spongiform nodule without significant vascularity. IMPRESSION: Thyromegaly with dominant right upper lobe previously biopsied nodule and several left sided nodules of size and characteristics described above. Some nodules may meet consensus criteria for FNA biopsy. Ultrasound-guided fine needle aspiration should be considered, as per the consensus statement: Management of Thyroid Nodules Detected at [place]: Society of Radiologists in [place]. Radiology 2005; [phone]." The above is in keeping with the ACR TI-RADS recommendations - J Am Coll Radiol 2017;14:587-595. Electronically Signed  By: Ashley Royalty M.D.   On: 03/27/2016 18:44   Time spent: 30 minutes  Signed:  Berle Mull  [facility] 03/29/2016 , 5:50 PM

## 2016-04-17 ENCOUNTER — Encounter (HOSPITAL_COMMUNITY): Payer: Self-pay

## 2016-04-17 ENCOUNTER — Ambulatory Visit (INDEPENDENT_AMBULATORY_CARE_PROVIDER_SITE_OTHER): Payer: Self-pay | Admitting: Obstetrics & Gynecology

## 2016-04-17 ENCOUNTER — Encounter: Payer: Self-pay | Admitting: Obstetrics & Gynecology

## 2016-04-17 ENCOUNTER — Other Ambulatory Visit (HOSPITAL_COMMUNITY)
Admission: RE | Admit: 2016-04-17 | Discharge: 2016-04-17 | Disposition: A | Discharge status: Home / Self Care | Payer: 59 | Source: Ambulatory Visit | Attending: Obstetrics & Gynecology | Admitting: Obstetrics & Gynecology

## 2016-04-17 VITALS — BP 126/80 | HR 65 | Wt 126.1 lb

## 2016-04-17 DIAGNOSIS — Z3202 Encounter for pregnancy test, result negative: Secondary | ICD-10-CM

## 2016-04-17 DIAGNOSIS — Z1151 Encounter for screening for human papillomavirus (HPV): Secondary | ICD-10-CM

## 2016-04-17 DIAGNOSIS — N921 Excessive and frequent menstruation with irregular cycle: Secondary | ICD-10-CM | POA: Diagnosis not present

## 2016-04-17 DIAGNOSIS — D638 Anemia in other chronic diseases classified elsewhere: Secondary | ICD-10-CM | POA: Diagnosis not present

## 2016-04-17 DIAGNOSIS — D251 Intramural leiomyoma of uterus: Secondary | ICD-10-CM

## 2016-04-17 DIAGNOSIS — Z0000 Encounter for general adult medical examination without abnormal findings: Secondary | ICD-10-CM

## 2016-04-17 DIAGNOSIS — Z124 Encounter for screening for malignant neoplasm of cervix: Secondary | ICD-10-CM | POA: Diagnosis not present

## 2016-04-17 LAB — POCT PREGNANCY, URINE: Preg Test, Ur: NEGATIVE

## 2016-04-17 MED ORDER — MEGESTROL ACETATE 40 MG PO TABS: 40.0000 mg | ORAL_TABLET | Freq: Two times a day (BID) | ORAL | 5 refills | Status: DC

## 2016-04-17 NOTE — Progress Notes (Signed)
   Subjective:    Patient ID: Nafisah Fiore, female    DOB: 11/12/1970, 46 y.o.   MRN: [id number]  HPI 46 yo A lady here for follow up after an admission to the hospital for a hbg of 3.3. She was given 4 units of PRBCs but is still bleeding. Her u/s showed a 19 cm uterus with a large fibroid.   Review of Systems     Objective:   Physical Exam Thin AFNAD Interpretor present Breathing, conversing, and ambulating normally Uterus palpable to fundus  UPT negative, consent signed, time out done Cervix prepped with betadine and grasped with a single tooth tenaculum Uterus sounded to 18 cm Pipelle used for 2 passes with a moderate amount of tissue obtained. She tolerated the procedure well.          Assessment & Plan:  Preventative care- pap done today DUB/fibroid- EMBX Megace 40 mg BID Rec TAH/BS

## 2016-04-21 LAB — CYTOLOGY - PAP
Diagnosis: NEGATIVE
HPV: NOT DETECTED

## 2016-05-04 ENCOUNTER — Encounter (HOSPITAL_COMMUNITY): Payer: Self-pay

## 2016-05-05 ENCOUNTER — Inpatient Hospital Stay (HOSPITAL_COMMUNITY): Payer: 59 | Admitting: Certified Registered Nurse Anesthetist

## 2016-05-05 ENCOUNTER — Inpatient Hospital Stay (HOSPITAL_COMMUNITY)
Admission: RE | Admit: 2016-05-05 | Discharge: 2016-05-07 | DRG: 743 | Disposition: A | Discharge status: Home / Self Care | Payer: 59 | Source: Ambulatory Visit | Attending: Obstetrics & Gynecology | Admitting: Obstetrics & Gynecology

## 2016-05-05 ENCOUNTER — Encounter (HOSPITAL_COMMUNITY)
Admission: RE | Disposition: A | Discharge status: Home / Self Care | Payer: Self-pay | Source: Ambulatory Visit | Attending: Obstetrics & Gynecology

## 2016-05-05 DIAGNOSIS — I10 Essential (primary) hypertension: Secondary | ICD-10-CM | POA: Diagnosis present

## 2016-05-05 DIAGNOSIS — Z9889 Other specified postprocedural states: Secondary | ICD-10-CM

## 2016-05-05 DIAGNOSIS — D500 Iron deficiency anemia secondary to blood loss (chronic): Secondary | ICD-10-CM | POA: Diagnosis not present

## 2016-05-05 DIAGNOSIS — D259 Leiomyoma of uterus, unspecified: Principal | ICD-10-CM | POA: Diagnosis present

## 2016-05-05 DIAGNOSIS — N920 Excessive and frequent menstruation with regular cycle: Secondary | ICD-10-CM | POA: Diagnosis present

## 2016-05-05 DIAGNOSIS — D649 Anemia, unspecified: Secondary | ICD-10-CM | POA: Diagnosis not present

## 2016-05-05 HISTORY — PX: HYSTERECTOMY ABDOMINAL WITH SALPINGECTOMY: SHX6725

## 2016-05-05 HISTORY — DX: Pure hypercholesterolemia, unspecified: E78.00

## 2016-05-05 HISTORY — DX: Essential (primary) hypertension: I10

## 2016-05-05 HISTORY — DX: Anemia, unspecified: D64.9

## 2016-05-05 HISTORY — DX: Personal history of other medical treatment: Z92.89

## 2016-05-05 HISTORY — DX: Other sleep disorders: G47.8

## 2016-05-05 LAB — CBC
HEMATOCRIT: 42 % (ref 36.0–46.0)
HEMOGLOBIN: 13.3 g/dL (ref 12.0–15.0)
MCH: 23.3 pg — AB (ref 26.0–34.0)
MCHC: 31.7 g/dL (ref 30.0–36.0)
MCV: 73.4 fL — AB (ref 78.0–100.0)
Platelets: 308 10*3/uL (ref 150–400)
RBC: 5.72 MIL/uL — ABNORMAL HIGH (ref 3.87–5.11)
RDW: 25.4 % — ABNORMAL HIGH (ref 11.5–15.5)
WBC: 7.1 10*3/uL (ref 4.0–10.5)

## 2016-05-05 LAB — PREGNANCY, URINE: Preg Test, Ur: NEGATIVE

## 2016-05-05 LAB — TYPE AND SCREEN
ABO/RH(D): O POS
Antibody Screen: NEGATIVE

## 2016-05-05 SURGERY — HYSTERECTOMY, TOTAL, ABDOMINAL, WITH SALPINGECTOMY
Anesthesia: General | Site: Abdomen | Laterality: Bilateral

## 2016-05-05 MED ORDER — BUPIVACAINE HCL (PF) 0.5 % IJ SOLN
INTRAMUSCULAR | Status: DC | PRN
  Administered 2016-05-05: 30 mL

## 2016-05-05 MED ORDER — GLYCOPYRROLATE 0.2 MG/ML IJ SOLN
INTRAMUSCULAR | Status: DC | PRN
  Administered 2016-05-05: 0.1 mg via INTRAVENOUS

## 2016-05-05 MED ORDER — MIDAZOLAM HCL 2 MG/2ML IJ SOLN
INTRAMUSCULAR | Status: DC | PRN
  Administered 2016-05-05: 2 mg via INTRAVENOUS

## 2016-05-05 MED ORDER — KETOROLAC TROMETHAMINE 30 MG/ML IJ SOLN
INTRAMUSCULAR | Status: AC
  Administered 2016-05-05: 30 mg via INTRAVENOUS
  Filled 2016-05-05: qty 1

## 2016-05-05 MED ORDER — SCOPOLAMINE 1 MG/3DAYS TD PT72
1.0000 | MEDICATED_PATCH | Freq: Once | TRANSDERMAL | Status: DC
  Administered 2016-05-05: 1.5 mg via TRANSDERMAL

## 2016-05-05 MED ORDER — MIDAZOLAM HCL 2 MG/2ML IJ SOLN
INTRAMUSCULAR | Status: AC
  Filled 2016-05-05: qty 2

## 2016-05-05 MED ORDER — ONDANSETRON HCL 4 MG/2ML IJ SOLN: 4.0000 mg | Freq: Four times a day (QID) | INTRAMUSCULAR | Status: DC | PRN

## 2016-05-05 MED ORDER — DEXAMETHASONE SODIUM PHOSPHATE 4 MG/ML IJ SOLN
INTRAMUSCULAR | Status: AC
  Filled 2016-05-05: qty 1

## 2016-05-05 MED ORDER — ONDANSETRON HCL 4 MG PO TABS: 4.0000 mg | ORAL_TABLET | Freq: Four times a day (QID) | ORAL | Status: DC | PRN

## 2016-05-05 MED ORDER — HYDROMORPHONE HCL 1 MG/ML IJ SOLN
0.2500 mg | INTRAMUSCULAR | Status: DC | PRN
  Administered 2016-05-05 (×3): 0.25 mg via INTRAVENOUS

## 2016-05-05 MED ORDER — ROCURONIUM BROMIDE 100 MG/10ML IV SOLN
INTRAVENOUS | Status: AC
  Filled 2016-05-05: qty 1

## 2016-05-05 MED ORDER — LIDOCAINE HCL (CARDIAC) 20 MG/ML IV SOLN
INTRAVENOUS | Status: AC
  Filled 2016-05-05: qty 5

## 2016-05-05 MED ORDER — ONDANSETRON HCL 4 MG/2ML IJ SOLN
INTRAMUSCULAR | Status: DC | PRN
  Administered 2016-05-05: 4 mg via INTRAVENOUS

## 2016-05-05 MED ORDER — LIDOCAINE HCL (CARDIAC) 20 MG/ML IV SOLN
INTRAVENOUS | Status: DC | PRN
  Administered 2016-05-05: 80 mg via INTRAVENOUS

## 2016-05-05 MED ORDER — DEXAMETHASONE SODIUM PHOSPHATE 10 MG/ML IJ SOLN
INTRAMUSCULAR | Status: DC | PRN
Start: 2016-05-05 — End: 2016-05-05
  Administered 2016-05-05: 4 mg via INTRAVENOUS

## 2016-05-05 MED ORDER — PROPOFOL 10 MG/ML IV BOLUS
INTRAVENOUS | Status: DC | PRN
  Administered 2016-05-05: 160 mg via INTRAVENOUS

## 2016-05-05 MED ORDER — OXYCODONE-ACETAMINOPHEN 5-325 MG PO TABS
1.0000 | ORAL_TABLET | ORAL | Status: DC | PRN
  Administered 2016-05-05: 1 via ORAL
  Administered 2016-05-06 (×2): 2 via ORAL
  Administered 2016-05-06: 1 via ORAL
  Administered 2016-05-06 – 2016-05-07 (×3): 2 via ORAL
  Filled 2016-05-05 (×2): qty 2
  Filled 2016-05-05: qty 1
  Filled 2016-05-05 (×2): qty 2
  Filled 2016-05-05: qty 1
  Filled 2016-05-05: qty 2

## 2016-05-05 MED ORDER — ROCURONIUM BROMIDE 100 MG/10ML IV SOLN
INTRAVENOUS | Status: DC | PRN
  Administered 2016-05-05: 40 mg via INTRAVENOUS

## 2016-05-05 MED ORDER — ONDANSETRON HCL 4 MG/2ML IJ SOLN
INTRAMUSCULAR | Status: AC
  Filled 2016-05-05: qty 2

## 2016-05-05 MED ORDER — CEFAZOLIN SODIUM-DEXTROSE 2-4 GM/100ML-% IV SOLN
2.0000 g | Freq: Once | INTRAVENOUS | Status: AC
  Administered 2016-05-05: 2 g via INTRAVENOUS

## 2016-05-05 MED ORDER — MEPERIDINE HCL 25 MG/ML IJ SOLN: 6.2500 mg | INTRAMUSCULAR | Status: DC | PRN

## 2016-05-05 MED ORDER — SCOPOLAMINE 1 MG/3DAYS TD PT72
MEDICATED_PATCH | TRANSDERMAL | Status: AC
  Administered 2016-05-05: 1.5 mg via TRANSDERMAL
  Filled 2016-05-05: qty 1

## 2016-05-05 MED ORDER — BUPIVACAINE HCL (PF) 0.5 % IJ SOLN
INTRAMUSCULAR | Status: AC
  Filled 2016-05-05: qty 30

## 2016-05-05 MED ORDER — KETOROLAC TROMETHAMINE 30 MG/ML IJ SOLN
INTRAMUSCULAR | Status: AC
  Filled 2016-05-05: qty 1

## 2016-05-05 MED ORDER — KETOROLAC TROMETHAMINE 30 MG/ML IJ SOLN
30.0000 mg | Freq: Once | INTRAMUSCULAR | Status: AC
  Administered 2016-05-05: 30 mg via INTRAVENOUS

## 2016-05-05 MED ORDER — PROMETHAZINE HCL 25 MG/ML IJ SOLN: 6.2500 mg | INTRAMUSCULAR | Status: DC | PRN

## 2016-05-05 MED ORDER — IBUPROFEN 800 MG PO TABS
800.0000 mg | ORAL_TABLET | Freq: Three times a day (TID) | ORAL | Status: DC | PRN
  Administered 2016-05-06: 800 mg via ORAL
  Filled 2016-05-05: qty 1

## 2016-05-05 MED ORDER — HYDROMORPHONE HCL 1 MG/ML IJ SOLN
0.2000 mg | INTRAMUSCULAR | Status: DC | PRN
  Administered 2016-05-05 – 2016-05-06 (×2): 0.6 mg via INTRAVENOUS
  Filled 2016-05-05 (×2): qty 1

## 2016-05-05 MED ORDER — PROPOFOL 10 MG/ML IV BOLUS
INTRAVENOUS | Status: AC
  Filled 2016-05-05: qty 20

## 2016-05-05 MED ORDER — SUGAMMADEX SODIUM 200 MG/2ML IV SOLN
INTRAVENOUS | Status: DC | PRN
  Administered 2016-05-05: 114.4 mg via INTRAVENOUS

## 2016-05-05 MED ORDER — SUGAMMADEX SODIUM 200 MG/2ML IV SOLN
INTRAVENOUS | Status: AC
  Filled 2016-05-05: qty 2

## 2016-05-05 MED ORDER — LACTATED RINGERS IV SOLN
INTRAVENOUS | Status: DC
  Administered 2016-05-05 (×2): via INTRAVENOUS

## 2016-05-05 MED ORDER — FENTANYL CITRATE (PF) 100 MCG/2ML IJ SOLN
INTRAMUSCULAR | Status: DC | PRN
  Administered 2016-05-05: 100 ug via INTRAVENOUS
  Administered 2016-05-05: 50 ug via INTRAVENOUS

## 2016-05-05 MED ORDER — HYDROMORPHONE HCL 1 MG/ML IJ SOLN
INTRAMUSCULAR | Status: AC
  Administered 2016-05-05: 0.25 mg via INTRAVENOUS
  Filled 2016-05-05: qty 1

## 2016-05-05 MED ORDER — FENTANYL CITRATE (PF) 250 MCG/5ML IJ SOLN
INTRAMUSCULAR | Status: AC
  Filled 2016-05-05: qty 5

## 2016-05-05 MED ORDER — LACTATED RINGERS IV SOLN
INTRAVENOUS | Status: DC
Start: 2016-05-05 — End: 2016-05-05
  Administered 2016-05-05: 12:00:00 via INTRAVENOUS

## 2016-05-05 SURGICAL SUPPLY — 33 items
CANISTER SUCT 3000ML (MISCELLANEOUS) ×2 IMPLANT
CLOTH BEACON ORANGE TIMEOUT ST (SAFETY) ×2 IMPLANT
CONT PATH 16OZ SNAP LID 3702 (MISCELLANEOUS) ×2 IMPLANT
DECANTER SPIKE VIAL GLASS SM (MISCELLANEOUS) IMPLANT
DRAPE CESAREAN BIRTH W POUCH (DRAPES) ×2 IMPLANT
DRAPE WARM FLUID 44X44 (DRAPE) IMPLANT
DRSG OPSITE POSTOP 4X10 (GAUZE/BANDAGES/DRESSINGS) ×2 IMPLANT
DURAPREP 26ML APPLICATOR (WOUND CARE) ×2 IMPLANT
GAUZE SPONGE 4X4 16PLY XRAY LF (GAUZE/BANDAGES/DRESSINGS) ×2 IMPLANT
GLOVE BIO SURGEON STRL SZ 6.5 (GLOVE) ×2 IMPLANT
GLOVE BIOGEL PI IND STRL 7.0 (GLOVE) ×2 IMPLANT
GLOVE BIOGEL PI INDICATOR 7.0 (GLOVE) ×2
GOWN STRL REUS W/TWL LRG LVL3 (GOWN DISPOSABLE) ×6 IMPLANT
HEMOSTAT ARISTA ABSORB 3G PWDR (MISCELLANEOUS) IMPLANT
NDL SPNL 18GX3.5 QUINCKE PK (NEEDLE) ×1 IMPLANT
NEEDLE SPNL 18GX3.5 QUINCKE PK (NEEDLE) ×2 IMPLANT
NS IRRIG 1000ML POUR BTL (IV SOLUTION) ×2 IMPLANT
PACK ABDOMINAL GYN (CUSTOM PROCEDURE TRAY) ×2 IMPLANT
PAD OB MATERNITY 4.3X12.25 (PERSONAL CARE ITEMS) ×2 IMPLANT
PENCIL SMOKE EVAC W/HOLSTER (ELECTROSURGICAL) ×2 IMPLANT
PROTECTOR NERVE ULNAR (MISCELLANEOUS) ×4 IMPLANT
SPONGE LAP 18X18 X RAY DECT (DISPOSABLE) ×4 IMPLANT
STRIP CLOSURE SKIN 1/2X4 (GAUZE/BANDAGES/DRESSINGS) ×2 IMPLANT
SUT CHROMIC 3 0 SH 27 (SUTURE) IMPLANT
SUT PDS AB 0 CTX 60 (SUTURE) ×2 IMPLANT
SUT VIC AB 0 CT1 36 (SUTURE) ×2 IMPLANT
SUT VIC AB 2-0 CT1 18 (SUTURE) ×6 IMPLANT
SUT VIC AB 3-0 CT1 27 (SUTURE) ×2
SUT VIC AB 3-0 CT1 TAPERPNT 27 (SUTURE) ×1 IMPLANT
SYR 30ML LL (SYRINGE) ×2 IMPLANT
TOWEL OR 17X24 6PK STRL BLUE (TOWEL DISPOSABLE) ×4 IMPLANT
TRAY FOLEY CATH SILVER 14FR (SET/KITS/TRAYS/PACK) ×2 IMPLANT
WATER STERILE IRR 1000ML POUR (IV SOLUTION) ×2 IMPLANT

## 2016-05-05 NOTE — Progress Notes (Signed)
Received a call from [employer] representative, in regards to pt needing a prior auth for surgery scheduled for today.  Mary informed me that pt is having an abdominal hysterectomy with salpingectomy.  She also informed me that pt has [employer].  Notified [employer] and was given pending authorization code [id number] and the need for clinical notes for review.  LM for [facility] the authorization code and if she has any questions to please give me call back.

## 2016-05-05 NOTE — Anesthesia Preprocedure Evaluation (Signed)
Anesthesia Evaluation  Patient identified by MRN, date of birth, ID band  Reviewed: Allergy & Precautions, NPO status , Patient's Chart, lab work & pertinent test results  Airway Mallampati: I       Dental no notable dental hx.    Pulmonary    Pulmonary exam normal        Cardiovascular Normal cardiovascular exam     Neuro/Psych    GI/Hepatic   Endo/Other    Renal/GU      Musculoskeletal   Abdominal Normal abdominal exam  (+)   Peds  Hematology   Anesthesia Other Findings   Reproductive/Obstetrics                             Anesthesia Physical Anesthesia Plan  ASA: II  Anesthesia Plan: General   Post-op Pain Management:    Induction: Intravenous  Airway Management Planned: Oral ETT  Additional Equipment:   Intra-op Plan:   Post-operative Plan: Extubation in OR  Informed Consent: I have reviewed the patients History and Physical, chart, labs and discussed the procedure including the risks, benefits and alternatives for the proposed anesthesia with the patient or authorized representative who has indicated his/her understanding and acceptance.     Plan Discussed with: CRNA and Surgeon  Anesthesia Plan Comments:         Anesthesia Quick Evaluation

## 2016-05-05 NOTE — Op Note (Signed)
05/05/2016  1:43 PM  PATIENT:  [name]  46 y.o. female  PRE-OPERATIVE DIAGNOSIS:  Fibroids, anemia, menorrhagia  POST-OPERATIVE DIAGNOSIS:  same   PROCEDURE:  Procedure(s): HYSTERECTOMY ABDOMINAL WITH SALPINGECTOMY (Bilateral)  SURGEON:  Surgeon(s) and Role:    * Emily Filbert, MD - Primary  PHYSICIAN ASSISTANT:   ASSISTANTS: Trellis Moment, FNA   ANESTHESIA:   general  EBL:  Total I/O In: 1000 [I.V.:1000] Out: 450 [Urine:300; Blood:150]  BLOOD ADMINISTERED:none  DRAINS: none   LOCAL MEDICATIONS USED:  MARCAINE     SPECIMEN:  Source of Specimen:  uterus and tubes  DISPOSITION OF SPECIMEN:  PATHOLOGY  COUNTS:  YES  TOURNIQUET:  * No tourniquets in log *  DICTATION: .Dragon Dictation  PLAN OF CARE: Admit to inpatient   PATIENT DISPOSITION:  PACU - hemodynamically stable.   Delay start of Pharmacological VTE agent (>24hrs) due to surgical blood loss or risk of bleeding: yes     The risks, benefits, and alternatives of surgery were explained, understood, and accepted. Consents were signed. All questions were answered. She was taken to the operating room and general anesthesia was applied without complication. Her abdomen and vagina were prepped and draped in the usual sterile fashion. A Foley catheter was placed which drained clear urine throughout the case. A transverse incision was made approximately 2 cm above her symphysis pubis after injecting 30 mL of 0.5% marcaine in the subcutaneous tissue. The incision was carried down through the subcutaneous tissue to the fascia. Bleeding encountered was cauterized with the Bovie. The fascia was scored the midline and the fascial incision was extended bilaterally. The pyramidalis muscles were separated in a transverse fashion using electrosurgical technique. Approximately 2 cm of the rectus muscles were separated in a transverse fashion in the midline using electrosurgical technique. Hemostasis was maintained. The peritoneum was  entered with hemostats and the peritoneal incision was extended bilaterally with the Bovie, taking care to avoid bowel and bladder. The patient was placed in Trendelenburg position and her bowel was packed out of the abdominal cavity. The pelvis was inspected. Her very large uterus filled the entire pelvis. I used towel clamps to elevate the uterus out of the incision. Coker clamps were used to elevate the uterus. The round ligaments were identified clamped cut and ligated. A bladder flap was created anteriorly and the bladder was pushed out of the operative site with a moist lap sponge. The uteroovarian ligaments were identified bilaterally. They were clamped, cut, and doubly ligated. Excellent hemostasis was noted. 2-0 Vicryl sutures used throughout this case unless otherwise specified. The uterine vessels were skeletonized, clamped, cut, and doubly ligated. The remainder of the cervix was separated from its pelvic attachments using the same clamp, cut, ligate technique. Curved Heaney clamps were used to clamp beneath the cervix. The cervix and uterus were removed and sent to pathology. The vaginal cuff was noted to be hemostatic as were the other pedicles. I removed the oviducts. The pedicles were hemostatic. The ovaries were both very small. The ureters were noted to be functioning and of normal caliber. The sponges were removed from the pelvis. The rectus muscles were inspected and hemostasis was assured. The fascia was closed with a 0 Vicryl running nonlocking suture. The subcutaneous tissue was irrigated, clean, dry.  A subcuticular closure was done with 3-0 vicryl suture. Steristrips were used across the incision. She tolerated the procedure well and was taken to the recovery room in stable condition. Her Foley catheter drained clear urine  throughout.

## 2016-05-05 NOTE — Transfer of Care (Signed)
Immediate Anesthesia Transfer of Care Note  Patient: [name]  Procedure(s) Performed: Procedure(s): HYSTERECTOMY ABDOMINAL WITH SALPINGECTOMY (Bilateral)  Patient Location: PACU  Anesthesia Type:General  Level of Consciousness: awake, alert  and oriented  Airway & Oxygen Therapy: Patient Spontanous Breathing and Patient connected to nasal cannula oxygen  Post-op Assessment: Report given to RN, Post -op Vital signs reviewed and stable and Patient moving all extremities  Post vital signs: Reviewed and stable  Last Vitals:  Vitals:   05/05/16 1117  BP: (!) 145/101  Pulse: 69  Resp: 16  Temp: 36.8 C    Last Pain:  Vitals:   05/05/16 1117  TempSrc: Oral      Patients Stated Pain Goal: 3 ([id number])  Complications: No apparent anesthesia complications

## 2016-05-05 NOTE — Anesthesia Procedure Notes (Signed)
Procedure Name: Intubation Date/Time: 05/05/2016 12:37 PM Performed by: Hewitt Blade Pre-anesthesia Checklist: Patient identified, Emergency Drugs available, Suction available and Patient being monitored Patient Re-evaluated:Patient Re-evaluated prior to inductionOxygen Delivery Method: Circle system utilized Preoxygenation: Pre-oxygenation with 100% oxygen Intubation Type: IV induction Ventilation: Mask ventilation without difficulty Laryngoscope Size: Mac and 3 Grade View: Grade I Tube type: Oral Tube size: 7.0 mm Number of attempts: 1 Airway Equipment and Method: Stylet Placement Confirmation: ETT inserted through vocal cords under direct vision,  positive ETCO2 and breath sounds checked- equal and bilateral Secured at: 21 cm Tube secured with: Tape Dental Injury: Teeth and Oropharynx as per pre-operative assessment

## 2016-05-05 NOTE — Anesthesia Postprocedure Evaluation (Addendum)
Anesthesia Post Note  Patient: [name]  Procedure(s) Performed: Procedure(s) (LRB): HYSTERECTOMY ABDOMINAL WITH SALPINGECTOMY (Bilateral)  Patient location during evaluation: [facility] Anesthesia Type: General Level of consciousness: awake and alert and oriented Pain management: pain level controlled Vital Signs Assessment: post-procedure vital signs reviewed and stable Respiratory status: spontaneous breathing, nonlabored ventilation and respiratory function stable Cardiovascular status: stable Postop Assessment: adequate PO intake and no signs of nausea or vomiting Anesthetic complications: no        Last Vitals:  Vitals:   05/05/16 1815 05/05/16 1900  BP: 132/81 128/86  Pulse:  62  Resp: 18   Temp: 36.9 C     Last Pain:  Vitals:   05/05/16 1954  TempSrc:   PainSc: 3    Pain Goal: Patients Stated Pain Goal: 2 (05/05/16 1824)               Willa Rough

## 2016-05-05 NOTE — H&P (Signed)
[name] is an 46 y.o. [place] M P3 here today for a TAH/BS due to heavy bleeding due to fibroids. She was admitted due to a hemoglobin of 3.3. She received transfusions. Her hbg was 8.8 last month. A EMBX was normal as was a pap smear.      Past Medical History:  Diagnosis Date  . Anemia   . High cholesterol   . History of blood transfusion   . Hypertension   . Menorrhagia   . Sleep paralysis   . Spontaneous abortion   . Thyroid nodule 2012    History reviewed. No pertinent surgical history.  Family History  Problem Relation Age of Onset  . Stroke Mother   . Thyroid disease Sister   . Heart disease Brother   . Hypertension Brother     Social History:  reports that she has never smoked. She has never used smokeless tobacco. She reports that she does not drink alcohol or use drugs.  Allergies: No Known Allergies  Prescriptions Prior to Admission  Medication Sig Dispense Refill Last Dose  . Acetaminophen (TYLENOL PO) Take 2 tablets by mouth every 6 (six) hours as needed (headache/pain).   Past Month at Unknown time  . ferrous sulfate 325 (65 FE) MG tablet Take 1 tablet (325 mg total) by mouth 3 (three) times daily with meals. 90 tablet 0 Past Month at Unknown time  . folic acid (FOLVITE) 1 MG tablet Take 1 tablet (1 mg total) by mouth daily. 30 tablet 0 Past Week at Unknown time  . megestrol (MEGACE) 40 MG tablet Take 1 tablet (40 mg total) by mouth 2 (two) times daily. 60 tablet 5 05/04/2016 at Unknown time  . vitamin B-12 1000 MCG tablet Take 1 tablet (1,000 mcg total) by mouth daily. 10 tablet 0 Past Week at Unknown time    ROS  Blood pressure (!) 145/101, pulse 69, temperature 98.2 F (36.8 C), temperature source Oral, resp. rate 16, height 5\' 4"  (1.626 m), weight 57.2 kg (126 lb), SpO2 100 %. Physical Exam  Heart- rrr Lungs- CTAB Abd- benign  No results found for this or any previous visit (from the past 24 hour(s)).  No results  found.  Assessment/Plan: Fibroids, anemia, menorrhagia- plan for TAH/BS.  She understands the risks of surgery, including, but not to infection, bleeding, DVTs, damage to bowel, bladder, ureters. She wishes to proceed.     Emily Filbert 05/05/2016, 12:04 PM

## 2016-05-06 ENCOUNTER — Encounter (HOSPITAL_COMMUNITY): Payer: Self-pay | Admitting: Obstetrics & Gynecology

## 2016-05-06 LAB — CBC
HCT: 36.2 % (ref 36.0–46.0)
HEMOGLOBIN: 11.7 g/dL — AB (ref 12.0–15.0)
MCH: 23.5 pg — AB (ref 26.0–34.0)
MCHC: 32.3 g/dL (ref 30.0–36.0)
MCV: 72.8 fL — ABNORMAL LOW (ref 78.0–100.0)
PLATELETS: 292 10*3/uL (ref 150–400)
RBC: 4.97 MIL/uL (ref 3.87–5.11)
RDW: 24.8 % — AB (ref 11.5–15.5)
WBC: 9.9 10*3/uL (ref 4.0–10.5)

## 2016-05-06 NOTE — Anesthesia Postprocedure Evaluation (Signed)
Anesthesia Post Note  Patient: Ameliah Curnow  Procedure(s) Performed: Procedure(s) (LRB): HYSTERECTOMY ABDOMINAL WITH SALPINGECTOMY (Bilateral)  Patient location during evaluation: PACU Anesthesia Type: General Level of consciousness: awake and sedated Pain management: pain level controlled Vital Signs Assessment: post-procedure vital signs reviewed and stable Respiratory status: spontaneous breathing Cardiovascular status: stable Postop Assessment: no signs of nausea or vomiting Anesthetic complications: no        Last Vitals:  Vitals:   05/06/16 0813 05/06/16 1208  BP: 140/72 137/80  Pulse: 62 63  Resp: 16 16  Temp: 37 C 37.6 C    Last Pain:  Vitals:   05/06/16 1208  TempSrc: Oral  PainSc:    Pain Goal: Patients Stated Pain Goal: 2 (05/06/16 1050)               Yoshimi Sarr JR,JOHN Mateo Flow

## 2016-05-06 NOTE — Progress Notes (Signed)
1 Day Post-Op Procedure(s) (LRB): HYSTERECTOMY ABDOMINAL WITH SALPINGECTOMY (Bilateral)  Subjective: Patient reports tolerating PO and no problems voiding.    Objective: I have reviewed patient's vital signs, intake and output, medications and labs.  General: alert Resp: clear to auscultation bilaterally Cardio: regular rate and rhythm, S1, S2 normal, no murmur, click, rub or gallop GI: soft, non-tender; bowel sounds normal; no masses,  no organomegaly Extremities: extremities normal, atraumatic, no cyanosis or edema Dressing- intact, small amount of old blood noted  Assessment: s/p Procedure(s): HYSTERECTOMY ABDOMINAL WITH SALPINGECTOMY (Bilateral): progressing well  Plan: Expect discharge home tomorrow  LOS: 1 day    Simpson 05/06/2016, 1:23 PM

## 2016-05-07 DIAGNOSIS — D259 Leiomyoma of uterus, unspecified: Principal | ICD-10-CM

## 2016-05-07 DIAGNOSIS — N852 Hypertrophy of uterus: Secondary | ICD-10-CM

## 2016-05-07 DIAGNOSIS — N939 Abnormal uterine and vaginal bleeding, unspecified: Secondary | ICD-10-CM

## 2016-05-07 MED ORDER — IBUPROFEN 800 MG PO TABS: 800.0000 mg | ORAL_TABLET | Freq: Three times a day (TID) | ORAL | 0 refills | Status: AC | PRN

## 2016-05-07 MED ORDER — OXYCODONE-ACETAMINOPHEN 5-325 MG PO TABS: 1.0000 | ORAL_TABLET | ORAL | 0 refills | Status: AC | PRN

## 2016-05-07 NOTE — Discharge Instructions (Signed)
Abdominal Hysterectomy, Care After °These instructions give you information on caring for yourself after your procedure. Your doctor may also give you more specific instructions. Call your doctor if you have any problems or questions after your procedure. °Follow these instructions at home: °It takes 4-6 weeks to recover from this surgery. Follow all of your doctor's instructions. °· Only take medicines as told by your doctor. °· Change your bandage as told by your doctor. °· Return to your doctor to have your stitches taken out. °· Take showers for 2-3 weeks. Ask your doctor when it is okay to shower. °· Do not douche, use tampons, or have sex (intercourse) for at least 6 weeks or as told. °· Follow your doctor's advice about exercise, lifting objects, driving, and general activities. °· Get plenty of rest and sleep. °· Do not lift anything heavier than a gallon of milk (about 10 pounds [4.5 kilograms]) for the first month after surgery. °· Get back to your normal diet as told by your doctor. °· Do not drink alcohol until your doctor says it is okay. °· Take a medicine to help you poop (laxative) as told by your doctor. °· Eating foods high in fiber may help you poop. Eat a lot of raw fruits and vegetables, whole grains, and beans. °· Drink enough fluids to keep your pee (urine) clear or pale yellow. °· Have someone help you at home for 1-2 weeks after your surgery. °· Keep follow-up doctor visits as told. °Contact a doctor if: °· You have chills or fever. °· You have puffiness, redness, or pain in area of the cut (incision). °· You have yellowish-white fluid (pus) coming from the cut. °· You have a bad smell coming from the cut or bandage. °· Your cut pulls apart. °· You feel dizzy or light-headed. °· You have pain or bleeding when you pee. °· You keep having watery poop (diarrhea). °· You keep feeling sick to your stomach (nauseous) or keep throwing up (vomiting). °· You have fluid (discharge) coming from your  vagina. °· You have a rash. °· You have a reaction to your medicine. °· You need stronger pain medicine. °Get help right away if: °· You have a fever and your symptoms suddenly get worse. °· You have bad belly (abdominal) pain. °· You have chest pain. °· You are short of breath. °· You pass out (faint). °· You have pain, puffiness, or redness of your leg. °· You bleed a lot from your vagina and notice clumps of tissue (clots). °This information is not intended to replace advice given to you by your health care provider. Make sure you discuss any questions you have with your health care provider. °Document Released: 12/17/2007 Document Revised: 08/15/2015 Document Reviewed: 12/30/2012 °Elsevier Interactive Patient Education © 2017 [employer] ° °

## 2016-05-07 NOTE — Discharge Summary (Signed)
Physician Discharge Summary  Patient ID: [name] MRN: [id number] DOB/AGE: April 22, 1970 46 y.o.  Admit date: 05/05/2016 Discharge date: 05/07/2016  Admission Diagnoses: fibroids, anemia, menorrhagia, anemia  Discharge Diagnoses: same Active Problems:   Post-operative state   Discharged Condition: good  Hospital Course: She underwent an uncomplicated TAH/BS. Her post op hbg was 11.7. By POD #1 she was ambulating, voiding, and tolerating po well. By POD #2, she voiced her readiness to go home.   Consults: None  Significant Diagnostic Studies: labs: as above  Treatments: surgery: TAH/BS  Discharge Exam: Blood pressure 127/70, pulse 64, temperature 98.7 F (37.1 C), temperature source Oral, resp. rate 15, height 5\' 4"  (1.626 m), weight 57.2 kg (126 lb), SpO2 95 %. General appearance: alert Resp: clear to auscultation bilaterally Cardio: regular rate and rhythm, S1, S2 normal, no murmur, click, rub or gallop GI: soft, non-tender; bowel sounds normal; no masses,  no organomegaly Incision/Wound: c/d/i/honeycomb dressing removed  Disposition: 01-Home or Self Care   Allergies as of 05/07/2016   No Known Allergies     Medication List    STOP taking these medications   cyanocobalamin 1000 MCG tablet   ferrous sulfate 325 (65 FE) MG tablet   folic acid 1 MG tablet Commonly known as:  FOLVITE   megestrol 40 MG tablet Commonly known as:  MEGACE   TYLENOL PO     TAKE these medications   ibuprofen 800 MG tablet Commonly known as:  ADVIL,MOTRIN Take 1 tablet (800 mg total) by mouth every 8 (eight) hours as needed (mild pain).   oxyCODONE-acetaminophen 5-325 MG tablet Commonly known as:  PERCOCET/ROXICET Take 1-2 tablets by mouth every 4 (four) hours as needed for severe pain (moderate to severe pain (when tolerating fluids)).      Follow-up Information    Emily Filbert, MD. Schedule an appointment as soon as possible for a visit in 6 weeks.   Specialty:  Obstetrics and  Gynecology Contact information: [place] [address] [phone]           Signed: Emily Filbert 05/07/2016, 8:22 AM

## 2016-05-21 ENCOUNTER — Telehealth: Payer: Self-pay

## 2016-05-21 NOTE — Telephone Encounter (Signed)
Completed fmla paper work

## 2016-06-24 ENCOUNTER — Encounter: Payer: Self-pay | Admitting: General Practice

## 2016-06-24 ENCOUNTER — Ambulatory Visit: Payer: 59 | Admitting: Obstetrics & Gynecology

## 2016-06-24 VITALS — BP 138/82 | HR 84 | Ht 64.0 in | Wt 120.0 lb

## 2016-06-24 DIAGNOSIS — Z9889 Other specified postprocedural states: Secondary | ICD-10-CM

## 2016-06-24 NOTE — Progress Notes (Signed)
   Subjective:    Patient ID: [name], female    DOB: 08-14-1970, 46 y.o.   MRN: [id number]  HPI  46 yo MA lady here for her post op visit. She had a TAH/BS about 7 weeks ago for symptomatic fibroids, had several transfusions prior to surgery. She reports normal bowel movements and bladder function. She has not had sex yet and wants a note to return to work. She is happy that she now feels like she has energy.  Review of Systems     Objective:   Physical Exam [id number] Interpretor present Breathing, conversing, and ambulating normally Abd- benign Incision- healed well Vaginal cuff- well healed Bimanual exam reveals no masses or tenderness       Assessment & Plan:  Postop- doing well RTC 1 year/prn sooner

## 2016-08-28 NOTE — Addendum Note (Signed)
Addendum  created 08/28/16 1001 by Lyn Hollingshead, MD   Sign clinical note

## 2017-04-21 IMAGING — US US PELVIS COMPLETE
1 series · 14 of 25 positions shown · non-contrast
Comparison: None

CLINICAL DATA: Vaginal bleeding

EXAM:
TRANSABDOMINAL AND TRANSVAGINAL ULTRASOUND OF PELVIS
TECHNIQUE: Both transabdominal and transvaginal ultrasound examinations of the
pelvis were performed. Transabdominal technique was performed for
global imaging of the pelvis including uterus, ovaries, adnexal
regions, and pelvic cul-de-sac. It was necessary to proceed with
endovaginal exam following the transabdominal exam to visualize the
uterus and ovaries..

[Series 1: us pelvis complete · 0.26mm/px · 14 of 48 slices shown]
[im 1/48]
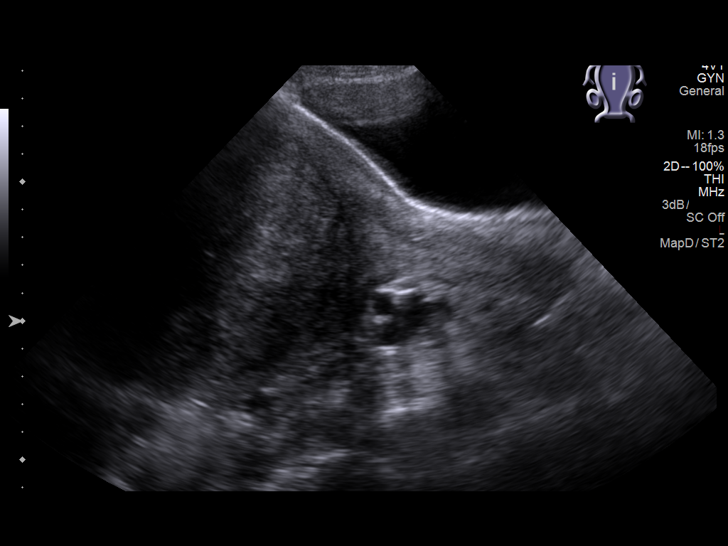
[im 4/48]
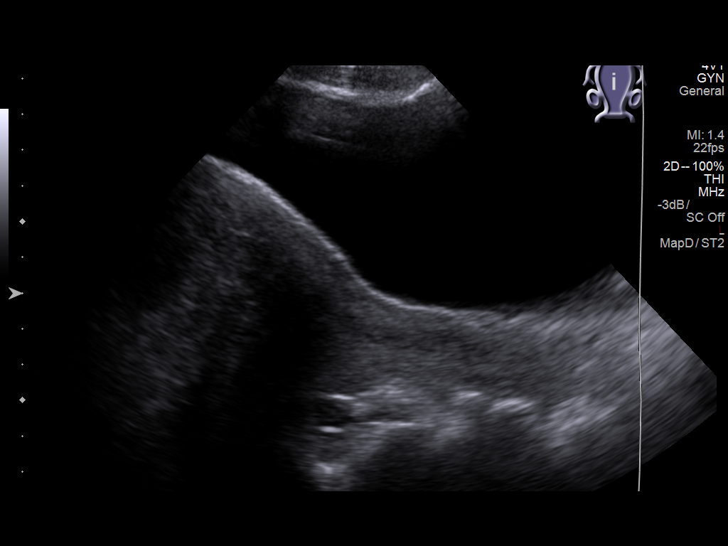
[im 8/48]
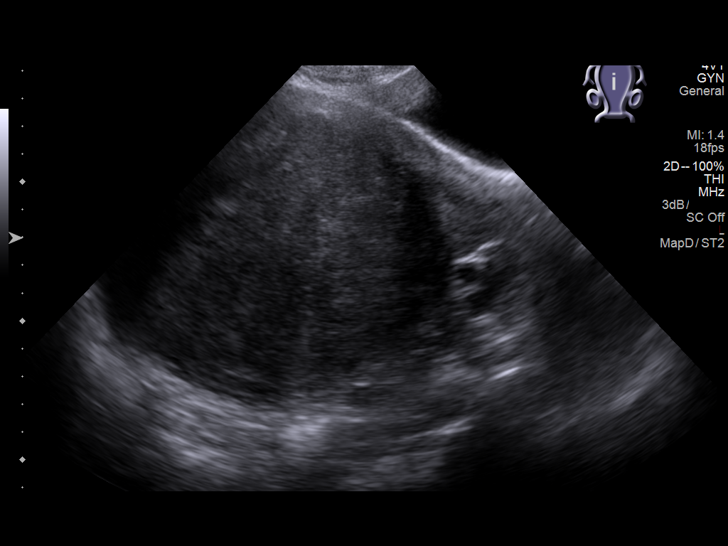
[im 12/48]
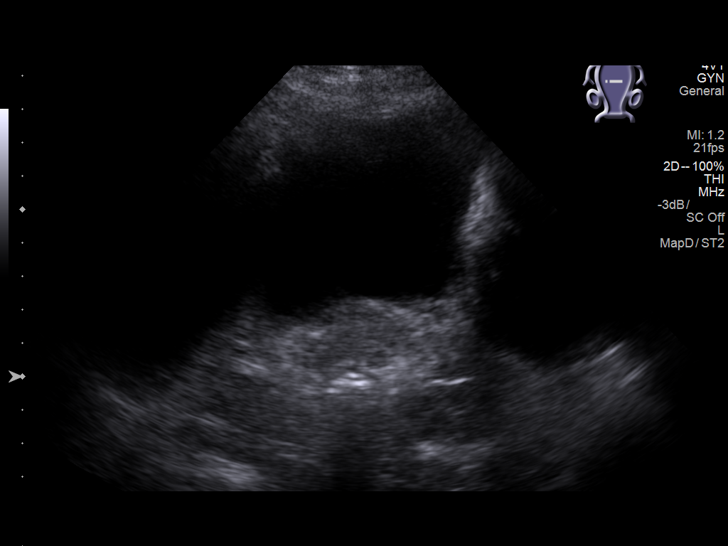
[im 16/48]
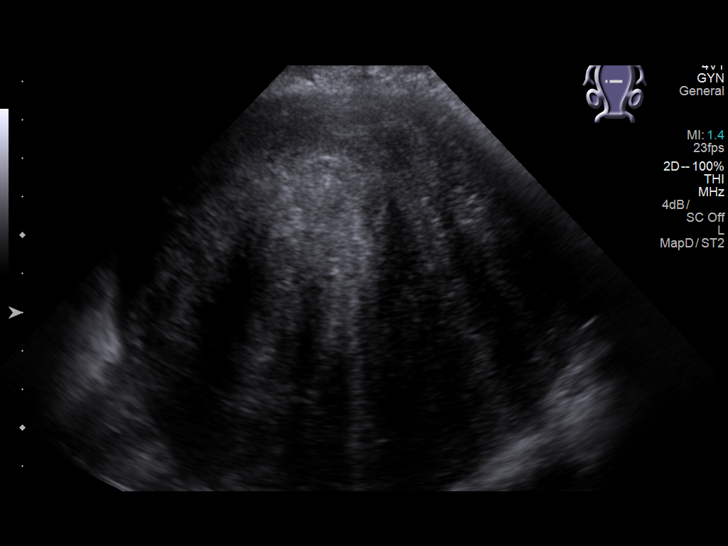
[im 18/48]
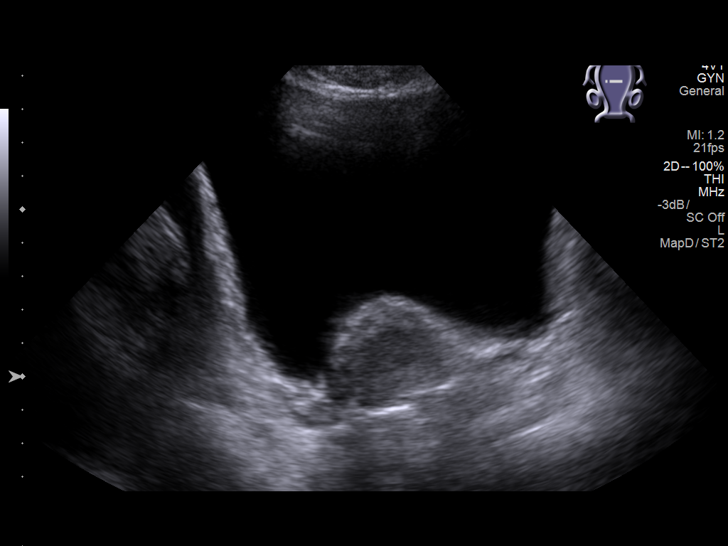
[im 22/48]
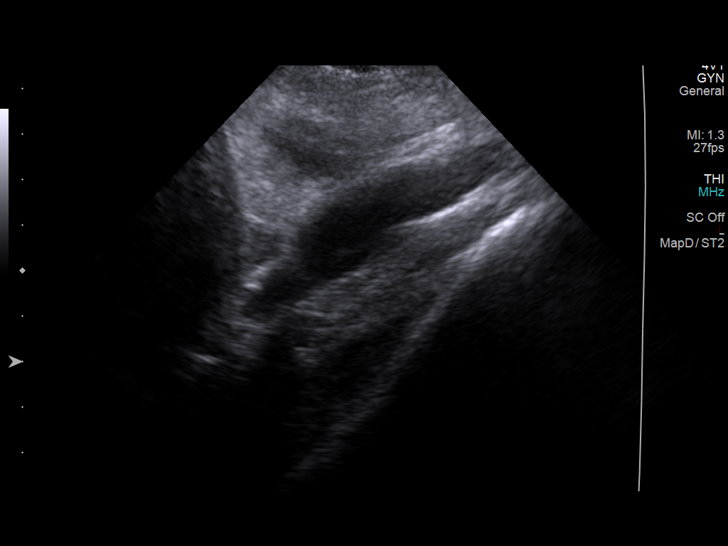
[im 26/48]
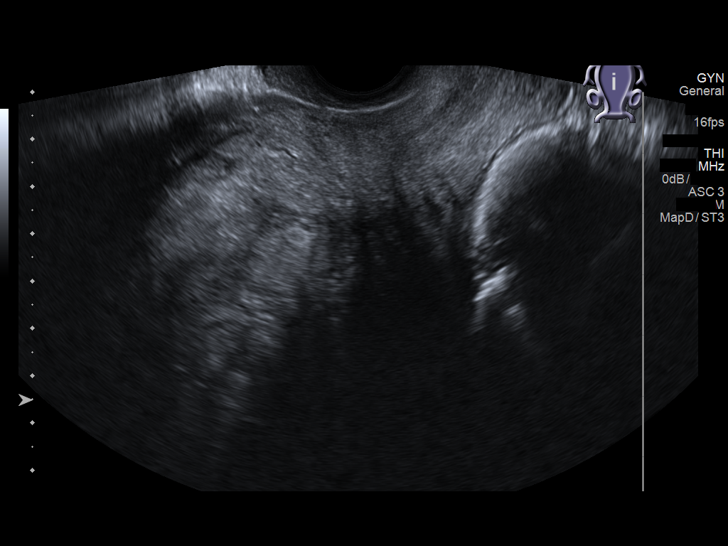
[im 30/48]
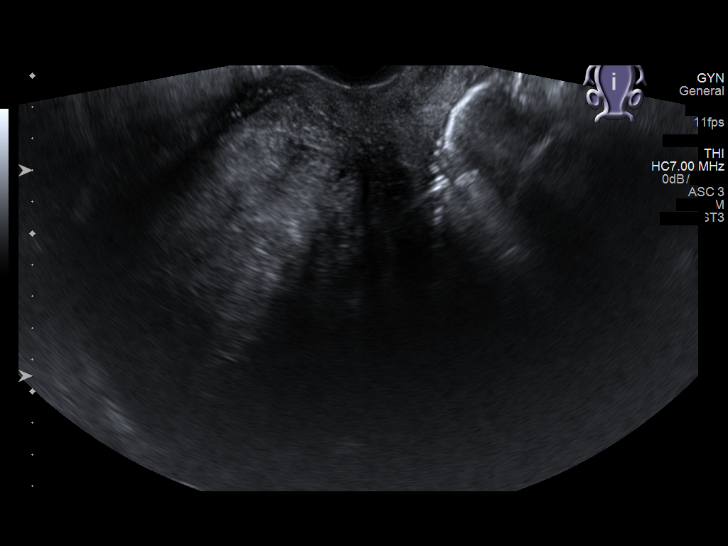
[im 32/48]
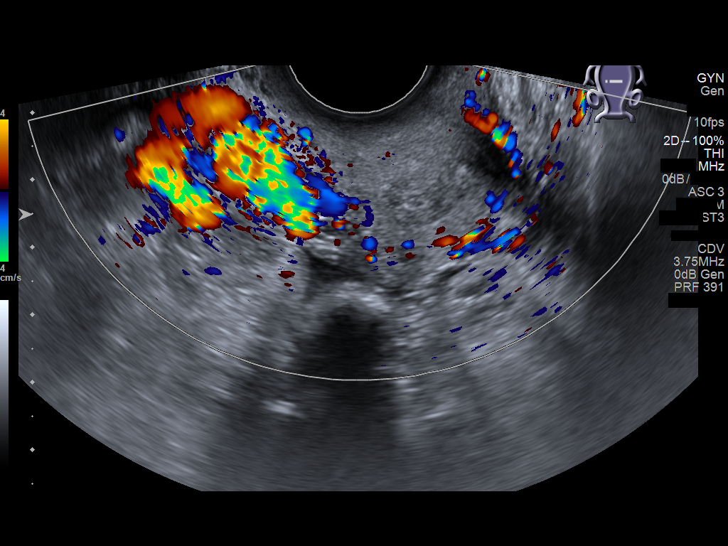
[im 36/48]
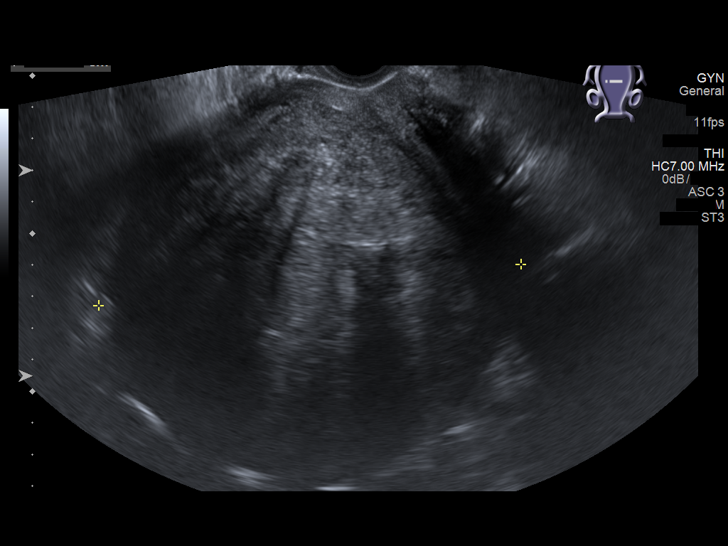
[im 40/48]
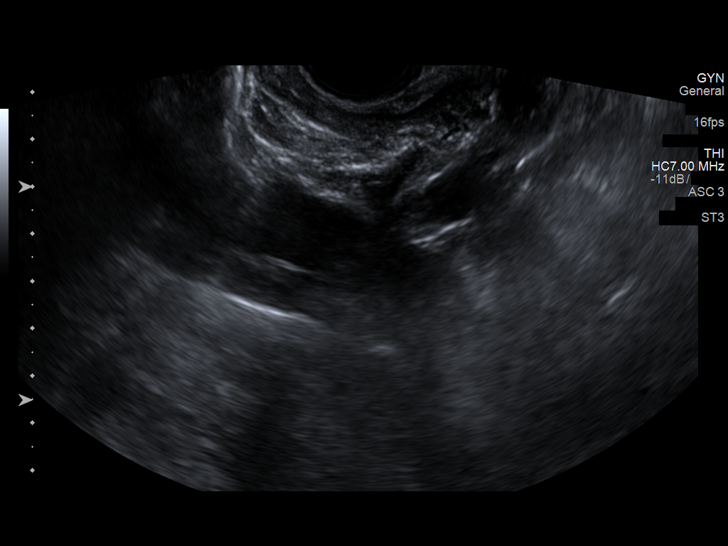
[im 44/48]
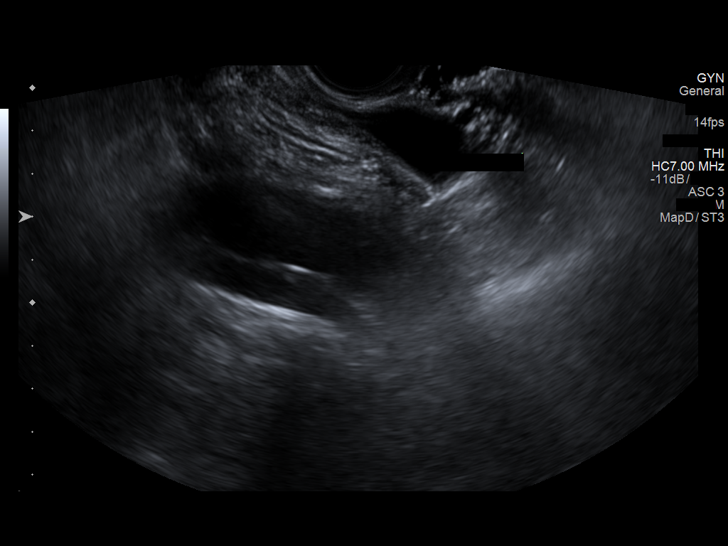
[im 48/48]
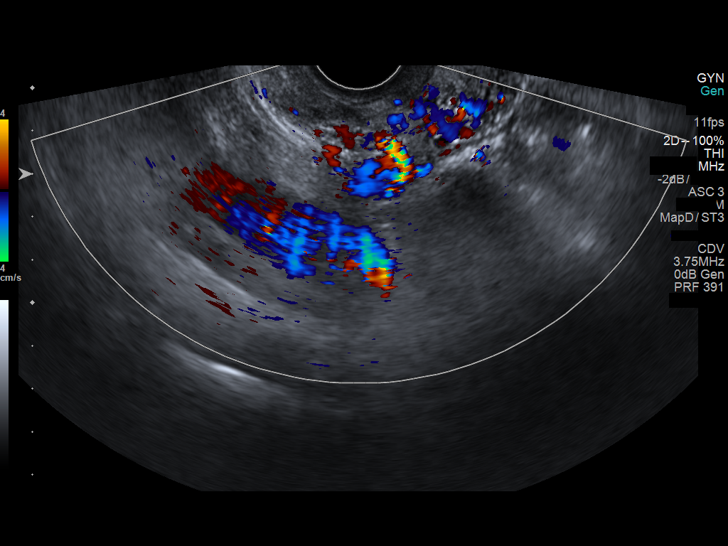

[14 of 25 positions shown; findings below may reference images not displayed]

FINDINGS: Uterus

Measurements: 19.6 x 11.9 x 14.7 cm. Large fundal fibroid measures
13.7 x 11.0 x 12.1 cm.

Endometrium

Thickness: Not visualized secondary to fibroid.

Right ovary

Measurements: Not visualized.

Left ovary

Measurements: Not visualized..

Other findings

No abnormal free fluid.
IMPRESSION: 1. Enlarged uterus contains a large fundal fibroid.

## 2017-05-19 ENCOUNTER — Ambulatory Visit (INDEPENDENT_AMBULATORY_CARE_PROVIDER_SITE_OTHER): Payer: 59 | Admitting: Obstetrics & Gynecology

## 2017-05-19 ENCOUNTER — Encounter: Payer: Self-pay | Admitting: Obstetrics & Gynecology

## 2017-05-19 VITALS — BP 134/88 | HR 102 | Ht 61.5 in | Wt 157.0 lb

## 2017-05-19 DIAGNOSIS — Z1239 Encounter for other screening for malignant neoplasm of breast: Secondary | ICD-10-CM

## 2017-05-19 DIAGNOSIS — Z01419 Encounter for gynecological examination (general) (routine) without abnormal findings: Secondary | ICD-10-CM

## 2017-05-19 DIAGNOSIS — R635 Abnormal weight gain: Secondary | ICD-10-CM

## 2017-05-19 NOTE — Progress Notes (Signed)
Subjective:    [name] is a 47 y.o. married P3 (65,6, and 6 year old kids) female who presents for an annual exam. She complains of about 20 pound weight gain. The patient is sexually active. GYN screening history: last pap: was normal. The patient wears seatbelts: yes. The patient participates in regular exercise: yes. Has the patient ever been transfused or tattooed?: no. The patient reports that there is not domestic violence in her life.   Menstrual History: OB History    No data available      Menarche age: 89 No LMP recorded. Patient has had a hysterectomy.    The following portions of the patient's history were reviewed and updated as appropriate: allergies, current medications, past family history, past medical history, past social history, past surgical history and problem list.  Review of Systems Pertinent items are noted in HPI.   She denies dyspareunia. FH- no breast/gyn/colon cancer   Objective:    BP 134/88   Pulse (!) 102   Ht 5' 1.5" (1.562 m)   Wt 157 lb (71.2 kg)   BMI 29.18 kg/m   General Appearance:    Alert, cooperative, no distress, appears stated age  Head:    Normocephalic, without obvious abnormality, atraumatic  Eyes:    PERRL, conjunctiva/corneas clear, EOM's intact, fundi    benign, both eyes  Ears:    Normal TM's and external ear canals, both ears  Nose:   Nares normal, septum midline, mucosa normal, no drainage    or sinus tenderness  Throat:   Lips, mucosa, and tongue normal; teeth and gums normal  Neck:   Supple, symmetrical, trachea midline, no adenopathy;    thyroid:  no enlargement/tenderness/nodules; no carotid   bruit or JVD  Back:     Symmetric, no curvature, ROM normal, no CVA tenderness  Lungs:     Clear to auscultation bilaterally, respirations unlabored  Chest Wall:    No tenderness or deformity   Heart:    Regular rate and rhythm, S1 and S2 normal, no murmur, rub   or gallop  Breast Exam:    No tenderness, masses, or nipple  abnormality  Abdomen:     Soft, non-tender, bowel sounds active all four quadrants,    no masses, no organomegaly  Genitalia:    Normal female without lesion, discharge or tenderness, mild vulvovaginal atrophy, normal vaginal cuff. No masses or tenderness with bimanual exam     Extremities:   Extremities normal, atraumatic, no cyanosis or edema  Pulses:   2+ and symmetric all extremities  Skin:   Skin color, texture, turgor normal, no rashes or lesions  Lymph nodes:   Cervical, supraclavicular, and axillary nodes normal  Neurologic:   CNII-XII intact, normal strength, sensation and reflexes    throughout  .    Assessment:    Healthy female exam.   Weight gain Plan:     Mammogram.   Check TSH

## 2017-05-20 LAB — TSH: TSH: 0.94 u[IU]/mL (ref 0.450–4.500)

## 2017-06-09 ENCOUNTER — Ambulatory Visit: Payer: Self-pay

## 2018-02-16 ENCOUNTER — Other Ambulatory Visit: Payer: Self-pay

## 2018-02-16 ENCOUNTER — Emergency Department (HOSPITAL_COMMUNITY)
Admission: EM | Admit: 2018-02-16 | Discharge: 2018-02-16 | Disposition: A | Discharge status: Home / Self Care | Payer: 59 | Attending: Emergency Medicine | Admitting: Emergency Medicine

## 2018-02-16 ENCOUNTER — Encounter (HOSPITAL_COMMUNITY): Payer: Self-pay | Admitting: Emergency Medicine

## 2018-02-16 DIAGNOSIS — R42 Dizziness and giddiness: Secondary | ICD-10-CM | POA: Insufficient documentation

## 2018-02-16 DIAGNOSIS — R509 Fever, unspecified: Secondary | ICD-10-CM | POA: Diagnosis not present

## 2018-02-16 DIAGNOSIS — R112 Nausea with vomiting, unspecified: Secondary | ICD-10-CM | POA: Diagnosis present

## 2018-02-16 DIAGNOSIS — I10 Essential (primary) hypertension: Secondary | ICD-10-CM | POA: Diagnosis not present

## 2018-02-16 DIAGNOSIS — R0902 Hypoxemia: Secondary | ICD-10-CM | POA: Diagnosis not present

## 2018-02-16 DIAGNOSIS — Z79899 Other long term (current) drug therapy: Secondary | ICD-10-CM | POA: Diagnosis not present

## 2018-02-16 LAB — COMPREHENSIVE METABOLIC PANEL
ALK PHOS: 51 U/L (ref 38–126)
ALT: 26 U/L (ref 0–44)
ANION GAP: 12 (ref 5–15)
AST: 31 U/L (ref 15–41)
Albumin: 3.7 g/dL (ref 3.5–5.0)
BUN: 11 mg/dL (ref 6–20)
CALCIUM: 8.9 mg/dL (ref 8.9–10.3)
CHLORIDE: 100 mmol/L (ref 98–111)
CO2: 25 mmol/L (ref 22–32)
CREATININE: 0.81 mg/dL (ref 0.44–1.00)
Glucose, Bld: 86 mg/dL (ref 70–99)
Potassium: 3.1 mmol/L — ABNORMAL LOW (ref 3.5–5.1)
SODIUM: 137 mmol/L (ref 135–145)
Total Bilirubin: 0.7 mg/dL (ref 0.3–1.2)
Total Protein: 6.9 g/dL (ref 6.5–8.1)

## 2018-02-16 LAB — CBC WITH DIFFERENTIAL/PLATELET
ABS IMMATURE GRANULOCYTES: 0.2 10*3/uL — AB (ref 0.00–0.07)
BASOS PCT: 1 %
Basophils Absolute: 0.1 10*3/uL (ref 0.0–0.1)
EOS PCT: 1 %
Eosinophils Absolute: 0.1 10*3/uL (ref 0.0–0.5)
HCT: 46.8 % — ABNORMAL HIGH (ref 36.0–46.0)
Hemoglobin: 13.8 g/dL (ref 12.0–15.0)
Immature Granulocytes: 2 %
Lymphocytes Relative: 36 %
Lymphs Abs: 4.6 10*3/uL — ABNORMAL HIGH (ref 0.7–4.0)
MCH: 24.6 pg — AB (ref 26.0–34.0)
MCHC: 29.5 g/dL — AB (ref 30.0–36.0)
MCV: 83.4 fL (ref 80.0–100.0)
MONO ABS: 0.9 10*3/uL (ref 0.1–1.0)
MONOS PCT: 7 %
NEUTROS ABS: 6.9 10*3/uL (ref 1.7–7.7)
Neutrophils Relative %: 53 %
PLATELETS: 383 10*3/uL (ref 150–400)
RBC: 5.61 MIL/uL — AB (ref 3.87–5.11)
RDW: 14.3 % (ref 11.5–15.5)
WBC: 12.8 10*3/uL — AB (ref 4.0–10.5)
nRBC: 0 % (ref 0.0–0.2)

## 2018-02-16 LAB — CBG MONITORING, ED
GLUCOSE-CAPILLARY: 63 mg/dL — AB (ref 70–99)
GLUCOSE-CAPILLARY: 83 mg/dL (ref 70–99)

## 2018-02-16 LAB — LIPASE, BLOOD: LIPASE: 30 U/L (ref 11–51)

## 2018-02-16 MED ORDER — MORPHINE SULFATE (PF) 4 MG/ML IV SOLN
4.0000 mg | Freq: Once | INTRAVENOUS | Status: AC
  Administered 2018-02-16: 4 mg via INTRAVENOUS
  Filled 2018-02-16: qty 1

## 2018-02-16 MED ORDER — SODIUM CHLORIDE 0.9 % IV BOLUS
1000.0000 mL | Freq: Once | INTRAVENOUS | Status: AC
  Administered 2018-02-16: 1000 mL via INTRAVENOUS

## 2018-02-16 MED ORDER — DIAZEPAM 5 MG/ML IJ SOLN
2.5000 mg | Freq: Once | INTRAMUSCULAR | Status: AC
  Administered 2018-02-16: 2.5 mg via INTRAVENOUS
  Filled 2018-02-16: qty 2

## 2018-02-16 MED ORDER — ONDANSETRON 4 MG PO TBDP: 4.0000 mg | ORAL_TABLET | Freq: Three times a day (TID) | ORAL | 0 refills | Status: AC | PRN

## 2018-02-16 MED ORDER — DIAZEPAM 5 MG PO TABS: 5.0000 mg | ORAL_TABLET | Freq: Two times a day (BID) | ORAL | 0 refills | Status: DC

## 2018-02-16 MED ORDER — ONDANSETRON HCL 4 MG/2ML IJ SOLN
4.0000 mg | Freq: Once | INTRAMUSCULAR | Status: AC
  Administered 2018-02-16: 4 mg via INTRAVENOUS
  Filled 2018-02-16: qty 2

## 2018-02-16 MED ORDER — DIAZEPAM 5 MG PO TABS: 5.0000 mg | ORAL_TABLET | Freq: Two times a day (BID) | ORAL | 0 refills | Status: AC

## 2018-02-16 MED ORDER — MECLIZINE HCL 25 MG PO TABS
25.0000 mg | ORAL_TABLET | Freq: Once | ORAL | Status: DC
  Filled 2018-02-16: qty 1

## 2018-02-16 MED ORDER — ONDANSETRON 4 MG PO TBDP: 4.0000 mg | ORAL_TABLET | Freq: Three times a day (TID) | ORAL | 0 refills | Status: DC | PRN

## 2018-02-16 NOTE — ED Triage Notes (Signed)
Pt here via [facility] from work, c/o abd pain this morning, nausea, diaphoresis, A&O x4. Pt does not speak english.

## 2018-02-16 NOTE — ED Notes (Addendum)
Discharge instructions and prescriptions discussed with Pt. Pt verbalized understanding. Pt stable and ambulatory.   

## 2018-02-16 NOTE — ED Notes (Signed)
Pt aware that a urine sample is needed.  

## 2018-02-16 NOTE — ED Notes (Signed)
Pt given apple juice  

## 2018-02-16 NOTE — ED Notes (Signed)
MD Sabra Heck aware of HR in 50's.  Will continue to monitor.

## 2018-02-16 NOTE — ED Notes (Signed)
Pt attempted to ambulate to bathroom but had to get back in bed due to dizziness. Will attempt to get a urine later. Pt aware that a sample is needed. Will continue to monitor.

## 2018-02-16 NOTE — ED Provider Notes (Addendum)
[facility]PART[facility] Provider Note   CSN: [id number] Arrival date & time: 02/16/18  1247     History   Chief Complaint Chief Complaint  Patient presents with  . Abdominal Pain    HPI [name] is a 47 y.o. female with h/o HTN here for evaluation of nausea, nbnb emesis x 1, nb diarrhea x 3 sudden onset while at work today.  States she has been feeling well the last few days but symptoms were sudden, severe, now resolved.  Reports associated transient, periumbilical abdominal pain, dizziness, global headache, sweats.  Dizziness described as "things moving", worse with movement, opening her eyes.  Dizziness better with sitting still and closing her eyes. She now feels generally tired.  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  Denies associated fevers, chills, vision changes, chest pain, shortness of breath, cough, urinary symptoms. No alleviating or aggravating factors. No interventions. H/o hysterectomy.   HPI  Past Medical History:  Diagnosis Date  . Anemia   . High cholesterol   . History of blood transfusion   . Hypertension   . Menorrhagia   . Sleep paralysis   . Spontaneous abortion   . Thyroid nodule 2012    Patient Active Problem List   Diagnosis Date Noted  . Post-operative state 05/05/2016  . Symptomatic anemia 03/27/2016  . Thyroid nodule 03/27/2016  . Iron deficiency 03/27/2016  . Menorrhagia 03/27/2016  . Uterine mass 03/27/2016  . Weight loss 03/27/2016    Past Surgical History:  Procedure Laterality Date  . HYSTERECTOMY ABDOMINAL WITH SALPINGECTOMY Bilateral 05/05/2016   Procedure: HYSTERECTOMY ABDOMINAL WITH SALPINGECTOMY;  Surgeon: Emily Filbert, MD;  Location: [facility];  Service: Gynecology;  Laterality: Bilateral;     OB History   None      Home Medications    Prior to Admission medications   Medication Sig Start Date End Date Taking? Authorizing Provider  acetaminophen (TYLENOL) 325 MG tablet Take  650 mg by mouth every 6 (six) hours as needed.    [provider]  diazepam (VALIUM) 5 MG tablet Take 1 tablet (5 mg total) by mouth 2 (two) times daily. 02/16/18   Kinnie Feil, PA-C  ibuprofen (ADVIL,MOTRIN) 800 MG tablet Take 1 tablet (800 mg total) by mouth every 8 (eight) hours as needed (mild pain). 05/07/16   Emily Filbert, MD  Multiple Vitamin (MULTIVITAMIN) tablet Take 1 tablet by mouth daily.    [provider]  ondansetron (ZOFRAN ODT) 4 MG disintegrating tablet Take 1 tablet (4 mg total) by mouth every 8 (eight) hours as needed for nausea or vomiting. 02/16/18   Kinnie Feil, PA-C  oxyCODONE-acetaminophen (PERCOCET/ROXICET) 5-325 MG tablet Take 1-2 tablets by mouth every 4 (four) hours as needed for severe pain (moderate to severe pain (when tolerating fluids)). 05/07/16   Emily Filbert, MD    Family History Family History  Problem Relation Age of Onset  . Stroke Mother   . Thyroid disease Sister   . Heart disease Brother   . Hypertension Brother     Social History Social History   Tobacco Use  . Smoking status: Never Smoker  . Smokeless tobacco: Never Used  Substance Use Topics  . Alcohol use: No  . Drug use: No     Allergies   Patient has no known allergies.   Review of Systems Review of Systems  Constitutional: Positive for diaphoresis and fatigue.  Gastrointestinal: Positive for abdominal pain, diarrhea,  nausea and vomiting.  Neurological: Positive for dizziness.  All other systems reviewed and are negative.    Physical Exam Updated Vital Signs BP (!) 137/93   Pulse (!) 58   Temp (!) 97.3 F (36.3 C) (Oral)   Resp 16   SpO2 94%   Physical Exam  Constitutional: She is oriented to person, place, and time. She appears well-developed and well-nourished.  Non toxic  HENT:  Head: Normocephalic and atraumatic.  Right Ear: External ear normal.  Left Ear: External ear normal.  Nose: Nose normal.  Mouth/Throat: Oropharynx is  clear and moist.  MMM. Slightly cloudy TMs bilaterally but still able to visualized bony landmarks, no bulging, erythema.   Eyes: Pupils are equal, round, and reactive to light. Conjunctivae and EOM are normal.  Neck: Normal range of motion.  Cardiovascular: Normal rate, regular rhythm and normal heart sounds.  Pulmonary/Chest: Effort normal and breath sounds normal.  Abdominal: Soft. Bowel sounds are normal. There is tenderness in the periumbilical area.  No G/R/R. No suprapubic or CVA tenderness. Negative Murphy's and McBurney's. Active BS to lower quadrants.   Musculoskeletal: Normal range of motion.  Neurological: She is alert and oriented to person, place, and time.  Alert and oriented to self, place, time and event.  Speech is fluent without obvious dysarthria or dysphasia. Strength 5/5 with hand grip and ankle F/E.   Sensation to light touch intact in hands and feet. Normal finger-to-nose.  Noted to have sudden return of dizziness upon sitting up in bed, after a few seconds she felt better and able to ambulate. Steady tandem walking. Cautious but steady gait. Normal romberg.  CN II-XII grossly intact bilaterally.    HiNTs Exam Head impulse: slight lag; saccade correction back to midline Nystagmus: horizontal nystagmus to right Test of skey: normal vertical eye alignment without deviation  Skin: Skin is warm and dry. Capillary refill takes less than 2 seconds.  Psychiatric: She has a normal mood and affect. Her behavior is normal.  Nursing note and vitals reviewed.    ED Treatments / Results  Labs (all labs ordered are listed, but only abnormal results are displayed) Labs Reviewed  CBC WITH DIFFERENTIAL/PLATELET - Abnormal; Notable for the following components:      Result Value   WBC 12.8 (*)    RBC 5.61 (*)    HCT 46.8 (*)    MCH 24.6 (*)    MCHC 29.5 (*)    Lymphs Abs 4.6 (*)    Abs Immature Granulocytes 0.20 (*)    All other components within normal limits    COMPREHENSIVE METABOLIC PANEL - Abnormal; Notable for the following components:   Potassium 3.1 (*)    All other components within normal limits  CBG MONITORING, ED - Abnormal; Notable for the following components:   Glucose-Capillary 63 (*)    All other components within normal limits  LIPASE, BLOOD  CBG MONITORING, ED    EKG EKG Interpretation  Date/Time:  Wednesday February 16 2018 13:02:40 EST Ventricular Rate:  55 PR Interval:    QRS Duration: 92 QT Interval:  507 QTC Calculation: 485 R Axis:   -2 Text Interpretation:  Sinus rhythm Borderline prolonged PR interval Since last tracing rate slower Confirmed by Noemi Chapel [phone]) on 02/16/2018 2:14:46 PM   Radiology No results found.  Procedures Procedures (including critical care time)  Medications Ordered in ED Medications  ondansetron (ZOFRAN) injection 4 mg (4 mg Intravenous Given 02/16/18 1316)  morphine 4 MG/ML injection 4 mg (  4 mg Intravenous Given 02/16/18 1316)  sodium chloride 0.9 % bolus 1,000 mL (0 mLs Intravenous Stopped 02/16/18 1519)  diazepam (VALIUM) injection 2.5 mg (2.5 mg Intravenous Given 02/16/18 1538)     Initial Impression / Assessment and Plan / ED Course  I have reviewed the triage vital signs and the nursing notes.  Pertinent labs & imaging results that were available during my care of the patient were reviewed by me and considered in my medical decision making (see chart for details).  Clinical Course as of Feb 17 1656  Wed Feb 16, 2018  1505 Potassium(!): 3.1 [CG]  1505 WBC(!): 12.8 [CG]  1505 Pulse Rate(!): 57 [CG]    Clinical Course User Index [CG] Kinnie Feil, PA-C    I suspect viral GI process.  No signs of significant dehydration on exam. Periumbilical tenderness initially but on repeat abd exam this resolved.  Negative Murphy's, Mcburnye's. No suprapubic or CVA tenderness. I considered appendicitis, cholecystitis, renal stone whit this is unlikely given clinical  presentation and improving abd exam.  In regards to dizziness, this sounds vertiginous in nature.  Dizziness reproduced with head rotation on Hiints exam.  Her Hiints exam is inconsistent with central cause of dizziness. She had no cardiac symptoms prior/after.  We will obtain screening labs, EKG, UA, given IVF, zofran and reassess.    1655: labs reassuring other than mild hypokalemia without EKG changes.  Symptoms improved with valium but pt had persistent intermittent dizziness with movement only.  I suspect peripheral dizziness given intermittent, reproducible symptoms.  No red flag symptoms including focal weakness or sensory deficits to extremities, sudden onset/severe headache, associated LOC, pain anywhere, shortness of breath, chest pain, palpitations. Neuro exam normal, HiNTS exam suggestive of peripheral cuase. Patient w vertiginous symptoms with head rotation.  No diplopia, dysarthria, dysphagia, dysmetria, weakness or sensory loss to extremities.  I personally ambulated patient who had a steady gait without ataxia or sudden onset of nausea or vomiting.  I did not appreciate any tremors or shakiness while walking. ENT exam normal.  VS reassuring.  No hyponatremia or other electrolyte abnormalities.  I doubt cardiac etiology, EKG without arrythmias/ischemia.   Patient is considered safe to discharge at this time.  I do not think further lab work or emergent imaging is indicated at this time.  Doubt central cause of symptoms including stroke or TIA.  Doubt cardiac arrhythmia.  Will discharge with symptomatic tx for vertigo, ENT f/u.  Discussed symptoms of cerebellar stroke and gave strict return precautions to son, husband, pt.  Patient aware of red flag symptoms to monitor for that would warrant return to ED.   Final Clinical Impressions(s) / ED Diagnoses   Final diagnoses:  Postural dizziness    ED Discharge Orders         Ordered    diazepam (VALIUM) 5 MG tablet  2 times daily,   Status:   Discontinued     02/16/18 1646    ondansetron (ZOFRAN ODT) 4 MG disintegrating tablet  Every 8 hours PRN,   Status:  Discontinued     02/16/18 1646    diazepam (VALIUM) 5 MG tablet  2 times daily     02/16/18 1652    ondansetron (ZOFRAN ODT) 4 MG disintegrating tablet  Every 8 hours PRN     02/16/18 1652           Kinnie Feil, PA-C 02/16/18 [place], [place], PA-C 02/17/18 [phone]  Noemi Chapel, MD 02/17/18 1147

## 2018-02-16 NOTE — Discharge Instructions (Signed)
You are seen in the ER for dizziness.  I suspect your symptoms are from vertigo.  Take Valium as prescribed at home, rest and avoid sudden head movements.  Symptoms may linger for several days.  Follow-up with ear nose and throat specialist if symptoms persist or are not resolving.  Return to the ER for persistent, worsening dizziness, difficulty walking, chest pain, shortness of breath, passing out episodes.

## 2023-07-23 ENCOUNTER — Other Ambulatory Visit
